# Patient Record
Sex: Female | Born: 1970
Health system: Southern US, Community
[De-identification: ages and names within clinical notes are randomized; demographics above are authoritative.]

## PROBLEM LIST (undated history)

## (undated) DIAGNOSIS — Z87442 Personal history of urinary calculi: Secondary | ICD-10-CM

## (undated) DIAGNOSIS — G43909 Migraine, unspecified, not intractable, without status migrainosus: Secondary | ICD-10-CM

## (undated) DIAGNOSIS — H409 Unspecified glaucoma: Secondary | ICD-10-CM

## (undated) DIAGNOSIS — J189 Pneumonia, unspecified organism: Secondary | ICD-10-CM

## (undated) DIAGNOSIS — B159 Hepatitis A without hepatic coma: Secondary | ICD-10-CM

## (undated) HISTORY — PX: SHOULDER ARTHROSCOPY: SHX128

## (undated) HISTORY — DX: Pneumonia, unspecified organism: J18.9

## (undated) HISTORY — PX: REPAIR PERONEAL TENDONS ANKLE: SUR1201

## (undated) HISTORY — DX: Migraine, unspecified, not intractable, without status migrainosus: G43.909

## (undated) HISTORY — DX: Hepatitis a without hepatic coma: B15.9

## (undated) HISTORY — DX: Unspecified glaucoma: H40.9

## (undated) HISTORY — PX: CERVICAL POLYPECTOMY: SHX88

---

## 1997-08-29 ENCOUNTER — Other Ambulatory Visit: Admission: RE | Admit: 1997-08-29 | Discharge: 1997-08-29 | Payer: Self-pay | Admitting: Obstetrics and Gynecology

## 1997-11-21 ENCOUNTER — Inpatient Hospital Stay (HOSPITAL_COMMUNITY): Admission: AD | Admit: 1997-11-21 | Discharge: 1997-11-23 | Payer: Self-pay | Admitting: Obstetrics and Gynecology

## 1997-11-26 ENCOUNTER — Encounter (HOSPITAL_COMMUNITY): Admission: RE | Admit: 1997-11-26 | Discharge: 1998-02-24 | Payer: Self-pay | Admitting: Obstetrics and Gynecology

## 1998-02-15 ENCOUNTER — Encounter: Payer: Self-pay | Admitting: Orthopedic Surgery

## 1998-02-15 ENCOUNTER — Ambulatory Visit (HOSPITAL_COMMUNITY): Admission: RE | Admit: 1998-02-15 | Discharge: 1998-02-15 | Payer: Self-pay | Admitting: Orthopedic Surgery

## 1998-05-25 ENCOUNTER — Other Ambulatory Visit: Admission: RE | Admit: 1998-05-25 | Discharge: 1998-05-25 | Payer: Self-pay | Admitting: Obstetrics & Gynecology

## 1999-05-17 ENCOUNTER — Other Ambulatory Visit: Admission: RE | Admit: 1999-05-17 | Discharge: 1999-05-17 | Payer: Self-pay | Admitting: Obstetrics and Gynecology

## 2000-01-31 ENCOUNTER — Inpatient Hospital Stay (HOSPITAL_COMMUNITY): Admission: AD | Admit: 2000-01-31 | Discharge: 2000-01-31 | Payer: Self-pay | Admitting: Obstetrics and Gynecology

## 2000-02-04 ENCOUNTER — Inpatient Hospital Stay (HOSPITAL_COMMUNITY): Admission: AD | Admit: 2000-02-04 | Discharge: 2000-02-04 | Payer: Self-pay | Admitting: Obstetrics & Gynecology

## 2000-02-05 ENCOUNTER — Inpatient Hospital Stay (HOSPITAL_COMMUNITY): Admission: AD | Admit: 2000-02-05 | Discharge: 2000-02-05 | Payer: Self-pay | Admitting: *Deleted

## 2000-02-08 ENCOUNTER — Inpatient Hospital Stay (HOSPITAL_COMMUNITY): Admission: AD | Admit: 2000-02-08 | Discharge: 2000-02-08 | Payer: Self-pay | Admitting: *Deleted

## 2000-02-14 ENCOUNTER — Inpatient Hospital Stay (HOSPITAL_COMMUNITY): Admission: AD | Admit: 2000-02-14 | Discharge: 2000-02-14 | Payer: Self-pay | Admitting: Obstetrics and Gynecology

## 2000-03-04 ENCOUNTER — Inpatient Hospital Stay (HOSPITAL_COMMUNITY): Admission: AD | Admit: 2000-03-04 | Discharge: 2000-03-06 | Payer: Self-pay | Admitting: Obstetrics and Gynecology

## 2000-04-24 ENCOUNTER — Other Ambulatory Visit: Admission: RE | Admit: 2000-04-24 | Discharge: 2000-04-24 | Payer: Self-pay | Admitting: Obstetrics and Gynecology

## 2001-11-20 ENCOUNTER — Other Ambulatory Visit: Admission: RE | Admit: 2001-11-20 | Discharge: 2001-11-20 | Payer: Self-pay | Admitting: Obstetrics and Gynecology

## 2002-03-15 ENCOUNTER — Encounter: Payer: Self-pay | Admitting: Obstetrics and Gynecology

## 2002-03-15 ENCOUNTER — Ambulatory Visit (HOSPITAL_COMMUNITY): Admission: RE | Admit: 2002-03-15 | Discharge: 2002-03-15 | Payer: Self-pay | Admitting: Obstetrics and Gynecology

## 2002-06-08 ENCOUNTER — Inpatient Hospital Stay (HOSPITAL_COMMUNITY): Admission: AD | Admit: 2002-06-08 | Discharge: 2002-06-10 | Payer: Self-pay | Admitting: Obstetrics and Gynecology

## 2002-12-29 ENCOUNTER — Other Ambulatory Visit: Admission: RE | Admit: 2002-12-29 | Discharge: 2002-12-29 | Payer: Self-pay | Admitting: Obstetrics and Gynecology

## 2003-02-07 ENCOUNTER — Ambulatory Visit (HOSPITAL_COMMUNITY): Admission: RE | Admit: 2003-02-07 | Discharge: 2003-02-07 | Payer: Self-pay | Admitting: Neurosurgery

## 2003-11-25 ENCOUNTER — Ambulatory Visit (HOSPITAL_COMMUNITY): Admission: RE | Admit: 2003-11-25 | Discharge: 2003-11-25 | Payer: Self-pay | Admitting: Obstetrics and Gynecology

## 2003-12-10 ENCOUNTER — Inpatient Hospital Stay (HOSPITAL_COMMUNITY): Admission: AD | Admit: 2003-12-10 | Discharge: 2003-12-10 | Payer: Self-pay | Admitting: Obstetrics and Gynecology

## 2004-02-03 ENCOUNTER — Other Ambulatory Visit: Admission: RE | Admit: 2004-02-03 | Discharge: 2004-02-03 | Payer: Self-pay | Admitting: Obstetrics and Gynecology

## 2004-03-15 ENCOUNTER — Ambulatory Visit (HOSPITAL_COMMUNITY): Admission: RE | Admit: 2004-03-15 | Discharge: 2004-03-15 | Payer: Self-pay | Admitting: Obstetrics and Gynecology

## 2004-04-13 ENCOUNTER — Inpatient Hospital Stay (HOSPITAL_COMMUNITY): Admission: AD | Admit: 2004-04-13 | Discharge: 2004-04-15 | Payer: Self-pay | Admitting: Obstetrics and Gynecology

## 2008-01-26 ENCOUNTER — Encounter: Admission: RE | Admit: 2008-01-26 | Discharge: 2008-01-26 | Payer: Self-pay | Admitting: Orthopedic Surgery

## 2010-08-03 NOTE — H&P (Signed)
[facility]  Patient:    Ashley Livingston, Ashley Livingston                    MRN: [id number] Adm. Date:  [id number] Disc. Date: [id number] Attending:  Shaune Spittle Dictator:   Nigel Bridgeman, C.N.M.                         History and Physical  HISTORY OF PRESENT ILLNESS:   Ashley Livingston is a 40 year old gravida 3, para 2-0-0-2 at 37 weeks who presents with uterine contractions every 5-8 minutes and pelvic pressure.  She also has had bloody show over the last several days. Cervix was 3 cm in the office on Friday.  PRENATAL LABORATORY DATA:     Blood type is A positive.  Rh antibody negative. VDRL nonreactive.  Rubella titer positive.  Hepatitis B surface antigen negative.  Pap smear was normal in May.  Glucose challenge was normal.  AFP was declined.  Hemoglobin upon entering the practice was 13.6.  It was 12.8 at 26 weeks.  EDC of March 24, 2000 was established by last menstrual period and was in agreement with ultrasound at approximately 18 weeks.  Group B strep culture was positive at 36 weeks.  HISTORY OF PRESENT PREGNANCY:            The patient entered care at approximately six weeks with her physical exam at 10 weeks.  She had a normal ultrasound at 18 weeks.  She had some vaginal bleeding at 22 weeks and was seen at a hospital in [place].  She had another ultrasound at 28 weeks which showed normal growth and normal fluid.  The ultrasound was done secondary to size less than date.  She had an ultrasound again at 32 weeks, again for size less than date, which showed decreased amniotic fluid and less than a tenth percentile growth. She began twice weekly NSTs.  She began to push p.o. fluids and was placed on bed rest.  She had a CST at 33 weeks for an equivocal CST.  At 34 weeks her cervix was 1 cm.  She had a follow-up ultrasound on February 14, 2000, at 34 weeks that showed normal fluid and normal growth.  From that point, the NSTs were  discontinued.  PREVIOUS PREGNANCY HISTORY:   In 1998, she had a vaginal birth of a female infant weighing 6 pounds 14 ounces at [redacted] weeks gestation.  She was in labor approximately five hours.  She had no anesthesia.  She had no complications. In 1999, she had a vaginal birth of a female infant, weight 7 pounds 12 ounces at 39 weeks.  She was in labor approximately five hours.  She had no complications.  In January 2001, she had a questionable early SAB.  She had a negative quantitative, but she did have a positive UPT earlier.  This is not factored into her pregnancy history.  PAST MEDICAL HISTORY:         She had Ortho-Novum last used in November 2000. Her last yeast infection was approximately nine years ago.  She was diagnosed with hepatitis A approximately four years ago in 1974 while living in [place]. It was contracted through water, and all siblings had it then.  When she was born, she had a lung collapsed and had a chest tube.  She had pneumonia with her first pregnancy.  She has a mild varicosity in her right leg.  ALLERGIES:  ASPIRIN causes swelling.  FAMILY HISTORY:               Her mother has varicosities in both legs.  Her maternal aunts both have thyroid issue.  One aunt is on medication.  GENETIC HISTORY:              Remarkable for the babys fathers first cousin was born with mental retardation.  SOCIAL HISTORY:               The patient is married to the father of the baby.  He is involved and supportive.  His name is Scott Fix.  He is graduate educated and employed as a Adult nurse.  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  She has been followed by the certified nurse services at [facility].  She denies any alcohol, drug, or tobacco use during this pregnancy.  She is Caucasian of the Catholic faith.  PHYSICAL EXAMINATION:  VITAL SIGNS:                  Stable.  Afebrile.  HEENT:                         Within normal limits.  LUNGS:                        Breath sounds are clear.  HEART:                        Regular rate and rhythm without murmur.  BREASTS:                      Soft and nontender.  ABDOMEN:                      Fundal height is approximately 34 cm.  Estimated fetal weight is 6 pounds.  Uterine contractions are every 2-4 minutes, mild quality.  CERVICAL:                     5-6 cm, 70% vertex at a -1 station.  Fetal heart rate is active with no decelerations.  There is a negative spontaneous CST.  EXTREMITIES:                  Deep tendon reflexes are 2+ without clonus. There is trace edema noted.  IMPRESSION:                   1. Intrauterine pregnancy at 37 weeks.                               2. Early labor.                               3. Positive Group B strep.  PLAN:                         1. Admit to birthing suite for consult with Dr.                                  Marline Backbone as attending physician.  2. Routine certified nurse widwife orders.                               3. Plan Group B strep prophylaxis as penicillin                                  G per protocol.                               4. Will offer artificial rupture of membranes                                  once AROM is initiated. DD:  03/04/00 TD:  03/04/00 Job: [id number]

## 2010-08-03 NOTE — H&P (Signed)
NAMEBRYLEE, BERK                       ACCOUNT NO.:  [id number]   MEDICAL RECORD NO.:  [id number]                   PATIENT TYPE:  INP   LOCATION:  9119                                 [facility]ILITY:  [facility]   PHYSICIAN:  Osborn Coho, M.D.                DATE OF BIRTH:  1971/01/04   DATE OF ADMISSION:  06/08/2002  DATE OF DISCHARGE:                                HISTORY & PHYSICAL   HISTORY OF PRESENT ILLNESS:  The patient is a 40 year old gravida 4 para 3-0-  0-3 who presents at [redacted] weeks gestation with contractions increasing in  frequency and intensity.  They are now four to five minutes apart for  greater than one hour.  She was evaluated in the office of [facility] this a.m.  for irregular contractions at that time.  She had positive fetal movement,  no bleeding, no rupture of membranes.  Her cervix was noted to be a stretchy  5 cm, 70% effaced, with the cephalic presenting part at that time at a -1  station.  As the patient was not actively laboring at that time she elected  to go home until labor symptoms increased, which she has indicated that they  have and she is now admitted in labor.  Her pregnancy has benign followed by  the C.N.M. service at [facility] and is remarkable for:  1. Irregular cycles.  2. History of rapid labor.  3. History of oligohydramnios with a past pregnancy.  4. Group B strep positive.   This patient was initially evaluated at the office of [facility] on November 20, 2001 at approximately [redacted] weeks gestation.  EDC determined by dates and  confirmed by pregnancy ultrasonography in the first and second trimester.  Her pregnancy has been essentially unremarkable.  She was size equal to  dates until the third trimester, during which she has measured size less  than dates.  Ultrasound exam has shown adequate growth and adequate fluid.   PRENATAL LABORATORY DATA:  On November 20, 2001:  Hemoglobin and hematocrit  13.2 and 39.1; platelets 193,000.  Blood type and Rh A  positive, antibody  screen negative.  VDRL nonreactive.  Rubella immune.  Hepatitis B surface  antigen negative.  HIV declined.  Pap smear within normal limits.  GC and  chlamydia negative.  AFP/free beta hCG declined.  At 28 weeks, one-hour  glucose challenge 121 and hemoglobin 11.9.  At 36 weeks culture of the  vaginal tract was positive for group B strep.   OBSTETRICAL HISTORY:  1. In 1998 the patient had a normal spontaneous vaginal delivery with the     birth of a 6 pound 14 ounce female infant named Santina Evans with no     complications, at term.  2. In 1999 the patient had a normal spontaneous vaginal delivery at term     with the birth of a 7 pound 12 ounce female infant named Animator  with no     complications.  3. In 2001 the patient had normal spontaneous vaginal delivery at term with     the birth of a 6 pound 3 ounce female infant named Angelia with no     complications.  4. The current pregnancy.   MEDICAL HISTORY:  The patient did contract hepatitis A in [place] in [place].  She has varicose veins in her right leg.  When she was born she had a  collapsed lung and had a chest tube.  She had pneumonia with her first  pregnancy.   FAMILY HISTORY:  Maternal aunt with thyroid disease, taking Synthroid.   GENETIC HISTORY:  A father-of-the-baby's first cousin has mental  retardation, ? etiology.  Otherwise, there is no family history of familial  or genetic disorders, children that died in infancy or that were born with  birth defects.   ALLERGIES:  No known drug allergies.   HABITS:  Denies the use of tobacco, alcohol, or illicit drugs.   SOCIAL HISTORY:  The patient is a married 40 year old Caucasian female.  Her  husband, Casimiro Needle, is involved and supportive.  They are Catholic in their  faith.   REVIEW OF SYSTEMS:  Are as described above.  The patient is typical of one  with a uterine pregnancy at term in early labor.   PHYSICAL EXAMINATION:  VITAL SIGNS:  Stable,  afebrile.  HEENT:  Unremarkable.  HEART:  Regular rate and rhythm.  LUNGS:  Clear.  ABDOMEN:  Gravid in its contour.  Uterine fundus is noted to extend 39 cm  above the level of the pubic symphysis.  Leopold's maneuvers finds the  infant to be in a longitudinal lie, cephalic presentation, and the estimated  fetal weight is 6.5 pounds.  EXTREMITIES:  Show no pathologic edema.  DTRs are 1+ and no clonus.   ASSESSMENT:  1. Intrauterine pregnancy at term.  2. Early labor.   PLAN:  1. Admit to birthing suites in early labor.  2. Routine C.N.M. orders.     Rica Koyanagi, C.N.M.               Osborn Coho, M.D.    SDM/MEDQ  D:  06/08/2002  T:  06/08/2002  Job:  [id number]

## 2010-08-03 NOTE — H&P (Signed)
Ashley Livingston, Ashley Livingston             ACCOUNT NO.:  [id number]   MEDICAL RECORD NO.:  [id number]          PATIENT TYPE:  INP   LOCATION:  9172                          [facility]ILITY:  [facility]   PHYSICIAN:  Hal Morales, M.D.DATE OF BIRTH:  [birth year]   DATE OF ADMISSION:  04/13/2004  DATE OF DISCHARGE:                                HISTORY & PHYSICAL   This is a 40 year old, gravida 5, para 4-0-0-4 at 68 and 2/7 weeks who  presents in active labor. She denies leaking or bleeding and reports  positive fetal movement.  The pregnancy has been followed by the nurse  midwife service and remarkable for:  1) AMA, 2) history of oligo with her  third pregnancy, 3) history of rapid labor, 4) group B strep positive.   OB HISTORY:  Remarkable for vaginal delivery in 1998 of a female infant at  [redacted] weeks gestation weighing 6 pounds 14 ounces with no complications. She  had a vaginal delivery in 1999 of a female infant at [redacted] weeks gestation  weighing 7 pounds 12 ounces with no complications.  She had a vaginal  delivery in 2001 of a female infant at [redacted] weeks gestation weighing 6 pounds  3 ounces remarkable for oligohydramnios. She had a vaginal delivery in 2004  of a female infant at [redacted] weeks gestation weighing 7 pounds 5 ounces with no  complications.   MEDICAL HISTORY:  Remarkable for childhood varicella, varicosities, and  pneumonia with her first pregnancy.   PAST SURGICAL HISTORY:  Unremarkable.   FAMILY HISTORY:  Remarkable for an aunt with hypothyroidism.   GENETIC HISTORY:  Remarkable for father of the baby's first cousin with  mental retardation.   SOCIAL HISTORY:  The patient is married to Deyana Wnuk who is involved  and supportive. She is of the catholic faith, she is a Futures trader.  Her  husband is  PT.  She denies any alcohol, tobacco or drug use.   ALLERGIES:  ASPIRIN causes swelling.   PRENATAL LABS:  Hemoglobin 13.6, platelets 213, blood type A positive,  antibody screen  negative, RPR nonreactive, rubella immune, hepatitis  negative.  HIV negative. Pap test normal.  Gonorrhea negative.  Chlamydia  negative.  Quad screen declined. Glucola normal and group B strep positive.   HISTORY OF CURRENT PREGNANCY:  The patient entered care at [redacted] weeks  gestation, she had a normal ultrasound at 18 weeks.  She had an episode of  diarrhea at 22 weeks which resolved. Glucola was normal and she had an  ultrasound at 34 weeks showing 75-90% growth and her group B strep was  positive.   OBJECTIVE DATA:  VITAL SIGNS:  Stable, afebrile.  HEENT:  Within normal limits.  Thyroid normal not enlarged.  CHEST:  Clear to auscultation.  HEART:  Regular rate and rhythm.  ABDOMEN:  Gravida at 38 cm, vertex [place]. EFM shows a reassuring fetal  heart rate with contractions every 2-4 minutes.  Cervix is 6-7 cm dilated,  80% effaced, -1 station with a vertex presentation.  EXTREMITIES:  Within normal limits.   ASSESSMENT:  1.  Intrauterine pregnancy  at term.  2.  Active labor.  3.  Group B strep positive.   PLAN:  1.  Admit to birthing suite, Dr. Normand Sloop to be notified.  2.  Routine CNM orders.  3.  Anticipate spontaneous vaginal delivery.  4.  Group B strep prophylaxis.      MLW/MEDQ  D:  04/13/2004  T:  04/13/2004  Job:  [id number]

## 2011-06-18 ENCOUNTER — Other Ambulatory Visit: Payer: Self-pay | Admitting: Obstetrics and Gynecology

## 2011-06-18 DIAGNOSIS — Z1231 Encounter for screening mammogram for malignant neoplasm of breast: Secondary | ICD-10-CM

## 2011-06-27 ENCOUNTER — Ambulatory Visit
Admission: RE | Admit: 2011-06-27 | Discharge: 2011-06-27 | Disposition: A | Discharge status: Home / Self Care | Payer: 59 | Source: Ambulatory Visit | Attending: Obstetrics and Gynecology | Admitting: Obstetrics and Gynecology

## 2011-06-27 DIAGNOSIS — Z1231 Encounter for screening mammogram for malignant neoplasm of breast: Secondary | ICD-10-CM

## 2014-10-09 ENCOUNTER — Ambulatory Visit (INDEPENDENT_AMBULATORY_CARE_PROVIDER_SITE_OTHER): Payer: 59 | Admitting: Internal Medicine

## 2014-10-09 VITALS — BP 122/72 | HR 80 | Temp 97.7°F | Resp 17 | Ht 68.0 in | Wt 152.0 lb

## 2014-10-09 DIAGNOSIS — J0100 Acute maxillary sinusitis, unspecified: Secondary | ICD-10-CM

## 2014-10-09 DIAGNOSIS — R509 Fever, unspecified: Secondary | ICD-10-CM

## 2014-10-09 DIAGNOSIS — J029 Acute pharyngitis, unspecified: Secondary | ICD-10-CM

## 2014-10-09 LAB — POCT CBC
Granulocyte percent: 81.4 %G — AB (ref 37–80)
HEMATOCRIT: 39.1 % (ref 37.7–47.9)
HEMOGLOBIN: 13.5 g/dL (ref 12.2–16.2)
LYMPH, POC: 1.5 (ref 0.6–3.4)
MCH: 29.3 pg (ref 27–31.2)
MCHC: 34.5 g/dL (ref 31.8–35.4)
MCV: 84.9 fL (ref 80–97)
MID (cbc): 0.2 (ref 0–0.9)
MPV: 7.7 fL (ref 0–99.8)
POC Granulocyte: 7.6 — AB (ref 2–6.9)
POC LYMPH %: 16.2 % (ref 10–50)
POC MID %: 2.4 %M (ref 0–12)
Platelet Count, POC: 264 10*3/uL (ref 142–424)
RBC: 4.6 M/uL (ref 4.04–5.48)
RDW, POC: 12.4 %
WBC: 9.3 10*3/uL (ref 4.6–10.2)

## 2014-10-09 LAB — POCT RAPID STREP A (OFFICE): Rapid Strep A Screen: NEGATIVE

## 2014-10-09 MED ORDER — AMOXICILLIN 875 MG PO TABS: 875.0000 mg | ORAL_TABLET | Freq: Two times a day (BID) | ORAL | Status: DC

## 2014-10-09 NOTE — Progress Notes (Addendum)
Subjective:    Patient ID: Ashley Livingston, female    DOB: 06-25-1970, 44 y.o.   MRN: [id number] This chart was scribed for Ellamae Sia, MD by Littie Deeds, Medical Scribe. This patient was seen in Room 1 and the patient's care was started at 12:49 PM.   HPI HPI Comments: Ashley Livingston is a 44 y.o. female who presents to the [facility] complaining of gradual onset fever with T-max 100 F that started 2 days ago. Patient had a worsening sore throat with burning pain that started 10 days ago, but this has resolved. She also reports having cough, splitting headache, generalized myalgias, congestion, ear fullness, and cervical lymph node swelling. She did have some SOB but she attributes this to the congestion and being in the heat. She has been taking Motrin for her symptoms. Patient denies rash. She had been traveling for 3 weeks in [place], including hiking in [place].  There are no active problems to display for this patient.    Review of Systems     Objective:   Physical Exam  Constitutional: She is oriented to person, place, and time. She appears well-developed and well-nourished. No distress.  HENT:  Head: Normocephalic and atraumatic.  Mouth/Throat: Oropharynx is clear and moist. No oropharyngeal exudate.  Both TMs with fluid Nares boggy with purulent discharge bilaterally  Eyes: Conjunctivae are normal. Pupils are equal, round, and reactive to light.  Neck: Neck supple. No thyromegaly present.  Cardiovascular: Normal rate, regular rhythm and normal heart sounds.   No murmur heard. Pulmonary/Chest: Effort normal and breath sounds normal. No respiratory distress. She has no wheezes.  Musculoskeletal: She exhibits no edema.  Lymphadenopathy:    She has no cervical adenopathy.  Neurological: She is alert and oriented to person, place, and time. No cranial nerve deficit.  Skin: Skin is warm and dry. No rash noted.  Psychiatric: She has a  normal mood and affect. Her behavior is normal.  Vitals reviewed.  BP 122/72 mmHg  Pulse 80  Temp(Src) 97.7 F (36.5 C) (Oral)  Resp 17  Ht  (1.727 m)  Wt 152 lb (68.947 kg)  BMI 23.12 kg/m2  SpO2 98%  LMP 09/25/2014 Results for orders placed or performed in visit on 10/09/14  POCT rapid strep A  Result Value Ref Range   Rapid Strep A Screen Negative Negative  POCT CBC  Result Value Ref Range   WBC 9.3 4.6 - 10.2 K/uL   Lymph, poc 1.5 0.6 - 3.4   POC LYMPH PERCENT 16.2 10 - 50 %L   MID (cbc) 0.2 0 - 0.9   POC MID % 2.4 0 - 12 %M   POC Granulocyte 7.6 (A) 2 - 6.9   Granulocyte percent 81.4 (A) 37 - 80 %G   RBC 4.60 4.04 - 5.48 M/uL   Hemoglobin 13.5 12.2 - 16.2 g/dL   HCT, POC 78.2 95.6 - 47.9 %   MCV 84.9 80 - 97 fL   MCH, POC 29.3 27 - 31.2 pg   MCHC 34.5 31.8 - 35.4 g/dL   RDW, POC 21.3 %   Platelet Count, POC 264 142 - 424 K/uL   MPV 7.7 0 - 99.8 fL          Assessment & Plan:  Acute maxillary sinusitis, recurrence not specified  Sore throat - Plan: POCT rapid strep A, Antistreptolysin O titer, POCT CBC, CANCELED: POCT glucose (manual entry)  Fever, unspecified fever cause  Meds  ordered this encounter  Medications  . cetirizine (ZYRTEC) 10 MG tablet    Sig: Take 10 mg by mouth daily.  Marland Kitchen amoxicillin (AMOXIL) 875 MG tablet    Sig: Take 1 tablet (875 mg total) by mouth 2 (two) times daily.    Dispense:  20 tablet    Refill:  0   ASO titer will help [place] tell if her preceding illness was strep during her travels  I have completed the patient encounter in its entirety as documented by the scribe, with editing by me where necessary. Camron Monday P. Merla Riches, M.D.

## 2014-10-12 LAB — ANTISTREPTOLYSIN O TITER: ASO: 41 IU/mL (ref <409)

## 2016-05-27 DIAGNOSIS — G43009 Migraine without aura, not intractable, without status migrainosus: Secondary | ICD-10-CM | POA: Insufficient documentation

## 2017-08-19 ENCOUNTER — Other Ambulatory Visit: Payer: Self-pay

## 2017-08-19 ENCOUNTER — Encounter: Payer: Self-pay | Admitting: Family Medicine

## 2017-08-19 ENCOUNTER — Ambulatory Visit (INDEPENDENT_AMBULATORY_CARE_PROVIDER_SITE_OTHER): Payer: 59 | Admitting: Family Medicine

## 2017-08-19 VITALS — BP 108/68 | HR 90 | Temp 98.5°F | Resp 16 | Ht 67.5 in | Wt 113.8 lb

## 2017-08-19 DIAGNOSIS — N200 Calculus of kidney: Secondary | ICD-10-CM | POA: Diagnosis not present

## 2017-08-19 DIAGNOSIS — N23 Unspecified renal colic: Secondary | ICD-10-CM | POA: Diagnosis not present

## 2017-08-19 DIAGNOSIS — R634 Abnormal weight loss: Secondary | ICD-10-CM

## 2017-08-19 LAB — POCT URINALYSIS DIPSTICK
Bilirubin, UA: NEGATIVE
Glucose, UA: NEGATIVE
Ketones, UA: NEGATIVE
LEUKOCYTES UA: NEGATIVE
NITRITE UA: NEGATIVE
PROTEIN UA: NEGATIVE
Spec Grav, UA: 1.02 (ref 1.010–1.025)
Urobilinogen, UA: 0.2 E.U./dL
pH, UA: 6.5 (ref 5.0–8.0)

## 2017-08-19 LAB — POCT URINE PREGNANCY: PREG TEST UR: NEGATIVE

## 2017-08-19 MED ORDER — KETOROLAC TROMETHAMINE 60 MG/2ML IM SOLN
60.0000 mg | Freq: Once | INTRAMUSCULAR | Status: AC
  Administered 2017-08-19: 60 mg via INTRAMUSCULAR

## 2017-08-19 MED ORDER — TAMSULOSIN HCL 0.4 MG PO CAPS: 0.4000 mg | ORAL_CAPSULE | Freq: Every day | ORAL | 0 refills | Status: DC

## 2017-08-19 MED ORDER — ONDANSETRON HCL 4 MG PO TABS: 4.0000 mg | ORAL_TABLET | Freq: Three times a day (TID) | ORAL | 0 refills | Status: DC | PRN

## 2017-08-19 MED ORDER — HYDROCODONE-ACETAMINOPHEN 5-325 MG PO TABS: 1.0000 | ORAL_TABLET | Freq: Four times a day (QID) | ORAL | 0 refills | Status: DC | PRN

## 2017-08-19 NOTE — Progress Notes (Signed)
Subjective  CC:  Chief Complaint  Patient presents with  . Establish Care    [facility] patient, pain in kidney area    HPI: Ashley Livingston is a 47 y.o. female is a former [facility] patient and is here to reestablish care with me today.    She has the following concerns or needs:  Started with mild left flank pain over weekend; then last night awoke with severe left flank pain; comes and goes. Associated with n/v and headache. No f/c/s or gross hematuria although she did note a pink tinge on TP after wiping. Also had several days of diarrhea but this has resolved. Had acute migraine prior to all of this and treated with 2 tryptans. Had 2 pieces of toast today; some water. No appetite. Has tension type headache now. No blurred vision, double visio-n, neck pain, paresis or slurred speech. No h/o kidney stones. No lower abdominal pain or vaginal sxs/discharge. G55 - condoms for birth control. Menses are irregular - more frequent recently.   Admits to weight loss: about 30-40 pounds in last several years. Has no interest in food. No appetite. Family is concerned.  Assessment  1. Nephrolithiasis   2. Renal colic on left side   3. Weight loss       Plan   Clinically consistent with kidney stone: zofran, flomax, norco, strain urine, nsaids and counseling given. Discussed red flags. rec f/u in 48 hours; sooner if worsens. If not improving, CT scan. Check labs.   Push fluids.   Concerned about weight loss : to address at f/u visits. Check tsh.   Follow up:  48 hours for recheck  Orders Placed This Encounter  Procedures  . CBC with Differential/Platelet  . Comprehensive metabolic panel  . TSH  . Urinalysis, Routine w reflex microscopic  . POCT Urinalysis Dipstick  . POCT urine pregnancy   Meds ordered this encounter  Medications  . ketorolac (TORADOL) injection 60 mg  . tamsulosin (FLOMAX) 0.4 MG CAPS capsule    Sig: Take 1 capsule (0.4 mg total) by mouth daily after breakfast.   Dispense:  14 capsule    Refill:  0  . ondansetron (ZOFRAN) 4 MG tablet    Sig: Take 1-2 tablets (4-8 mg total) by mouth every 8 (eight) hours as needed for nausea or vomiting.    Dispense:  20 tablet    Refill:  0  . HYDROcodone-acetaminophen (NORCO) 5-325 MG tablet    Sig: Take 1 tablet by mouth every 6 (six) hours as needed for moderate pain.    Dispense:  20 tablet    Refill:  0      We updated and reviewed the patient's past history in detail and it is documented below.  Patient Active Problem List   Diagnosis Date Noted  . Migraine without aura and without status migrainosus, not intractable 05/27/2016   Health Maintenance  Topic Date Due  . TETANUS/TDAP  07/30/1987  . PAP SMEAR  07/29/1989  . HIV Screening  08/20/2018 (Originally 07/30/1983)  . INFLUENZA VACCINE  10/16/2017    There is no immunization history on file for this patient. Current Meds  Medication Sig  . SUMAtriptan (IMITREX) 100 MG tablet TK 1 T PO ONCE PRF MIGRAINE  . topiramate (TOPAMAX) 50 MG tablet Take 50 mg by mouth 2 (two) times daily.    Allergies: Patient is allergic to asa [aspirin]. Past Medical History Patient  has a past medical history of Hepatitis A, Migraines, and Pneumonia.  Past Surgical History Patient  has a past surgical history that includes Shoulder arthroscopy and Repair peroneal tendons ankle. Family History: Patient family history includes Cancer in her father; Hyperlipidemia in her brother and father; Migraines in her brother, father, and sister. Social History:  Patient  reports that she has never smoked. She has never used smokeless tobacco. She reports that she drinks alcohol. She reports that she does not use drugs.  Review of Systems: Constitutional: negative for fever or malaise Ophthalmic: negative for photophobia, double vision or loss of vision Cardiovascular: negative for chest pain, dyspnea on exertion, or new LE swelling Respiratory: negative for SOB or  persistent cough Gastrointestinal: negative for abdominal pain, change in bowel habits or melena Genitourinary: negative for dysuria or gross hematuria Musculoskeletal: negative for new gait disturbance or muscular weakness Integumentary: negative for new or persistent rashes Neurological: negative for TIA or stroke symptoms Psychiatric: negative for SI or delusions Allergic/Immunologic: negative for hives  Patient Care Team    Relationship Specialty Notifications Start End  Willow Ora, MD PCP - General Family Medicine  08/19/17    Wt Readings from Last 3 Encounters:  08/19/17 113 lb 12.8 oz (51.6 kg)  10/09/14 152 lb (68.9 kg)    Objective  Vitals: BP 108/68   Pulse 90   Temp 98.5 F (36.9 C) (Oral)   Resp 16   Ht 5' 7.5" (1.715 m)   Wt 113 lb 12.8 oz (51.6 kg)   SpO2 98%   BMI 17.56 kg/m  General:  Extremely thin; appears uncomfortable but non-toxic. Normal speech Psych:  Alert and oriented,normal mood and affect HEENT:  Normocephalic, atraumatic, non-icteric sclera, PERRL, oropharynx is without mass or exudate, supple neck without adenopathy, mass or thyromegaly Cardiovascular:  RRR without gallop, rub or murmur, nondisplaced PMI Respiratory:  Good breath sounds bilaterally, CTAB with normal respiratory effort Gastrointestinal: normal bowel sounds, soft, non-tender, no noted masses. No HSM, no CVAT, no renal mass or ttp Skin:  Warm, no rashes Neurologic:    Mental status is normal. Gross motor and sensory exams are normal. Normal gait  Office Visit on 08/19/2017  Component Date Value Ref Range Status  . Color, UA 08/19/2017 yellow   Final  . Clarity, UA 08/19/2017 clear   Final  . Glucose, UA 08/19/2017 Negative  Negative Final  . Bilirubin, UA 08/19/2017 negative   Final  . Ketones, UA 08/19/2017 negative   Final  . Spec Grav, UA 08/19/2017 1.020  1.010 - 1.025 Final  . Blood, UA 08/19/2017 2+   Final  . pH, UA 08/19/2017 6.5  5.0 - 8.0 Final  . Protein, UA  08/19/2017 Negative  Negative Final  . Urobilinogen, UA 08/19/2017 0.2  0.2 or 1.0 E.U./dL Final  . Nitrite, UA 16/12/9602 negative   Final  . Leukocytes, UA 08/19/2017 Negative  Negative Final  . Preg Test, Ur 08/19/2017 Negative  Negative Final     Commons side effects, risks, benefits, and alternatives for medications and treatment plan prescribed today were discussed, and the patient expressed understanding of the given instructions. Patient is instructed to call or message via MyChart if he/she has any questions or concerns regarding our treatment plan. No barriers to understanding were identified. We discussed Red Flag symptoms and signs in detail. Patient expressed understanding regarding what to do in case of urgent or emergency type symptoms.   Medication list was reconciled, printed and provided to the patient in AVS. Patient instructions and summary information was reviewed with  the patient as documented in the AVS. This note was prepared with assistance of Dragon voice recognition software. Occasional wrong-word or sound-a-like substitutions may have occurred due to the inherent limitations of voice recognition software

## 2017-08-19 NOTE — Patient Instructions (Addendum)
It was so good seeing you again! Thank you for establishing with my new practice and allowing me to continue caring for you. It means a lot to me.    I will release your lab results to you on your MyChart account with further instructions. Please reply with any questions.  Return to office in 2 days for recheck.    Kidney Stones Kidney stones (urolithiasis) are rock-like masses that form inside of the kidneys. Kidneys are organs that make pee (urine). A kidney stone can cause very bad pain and can block the flow of pee. The stone usually leaves your body (passes) through your pee. You may need to have a doctor take out the stone. Follow these instructions at home: Eating and drinking  Drink enough fluid to keep your pee clear or pale yellow. This will help you pass the stone.  If told by your doctor, change the foods you eat (your diet). This may include: ? Limiting how much salt (sodium) you eat. ? Eating more fruits and vegetables. ? Limiting how much meat, poultry, fish, and eggs you eat.  Follow instructions from your doctor about eating or drinking restrictions. General instructions  Collect pee samples as told by your doctor. You may need to collect a pee sample: ? 24 hours after a stone comes out. ? 8-12 weeks after a stone comes out, and every 6-12 months after that.  Strain your pee every time you pee (urinate), for as long as told. Use the strainer that your doctor recommends.  Do not throw out the stone. Keep it so that it can be tested by your doctor.  Take over-the-counter and prescription medicines only as told by your doctor.  Keep all follow-up visits as told by your doctor. This is important. You may need follow-up tests. Preventing kidney stones To prevent another kidney stone:  Drink enough fluid to keep your pee clear or pale yellow. This is the best way to prevent kidney stones.  Eat healthy foods.  Avoid certain foods as told by your doctor. You may be  told to eat less protein.  Stay at a healthy weight.  Contact a doctor if:  You have pain that gets worse or does not get better with medicine. Get help right away if:  You have a fever or chills.  You get very bad pain.  You get new pain in your belly (abdomen).  You pass out (faint).  You cannot pee. This information is not intended to replace advice given to you by your health care provider. Make sure you discuss any questions you have with your health care provider. Document Released: 08/21/2007 Document Revised: 11/21/2015 Document Reviewed: 11/21/2015 Elsevier Interactive Patient Education  2017 [employer]

## 2017-08-20 LAB — URINALYSIS, ROUTINE W REFLEX MICROSCOPIC
BILIRUBIN URINE: NEGATIVE
KETONES UR: NEGATIVE
LEUKOCYTES UA: NEGATIVE
NITRITE: NEGATIVE
Specific Gravity, Urine: 1.015 (ref 1.000–1.030)
Total Protein, Urine: NEGATIVE
Urine Glucose: NEGATIVE
Urobilinogen, UA: 0.2 (ref 0.0–1.0)
pH: 7 (ref 5.0–8.0)

## 2017-08-20 LAB — COMPREHENSIVE METABOLIC PANEL
ALT: 17 U/L (ref 0–35)
AST: 19 U/L (ref 0–37)
Albumin: 4.2 g/dL (ref 3.5–5.2)
Alkaline Phosphatase: 70 U/L (ref 39–117)
BUN: 14 mg/dL (ref 6–23)
CHLORIDE: 107 meq/L (ref 96–112)
CO2: 22 mEq/L (ref 19–32)
Calcium: 9.3 mg/dL (ref 8.4–10.5)
Creatinine, Ser: 0.64 mg/dL (ref 0.40–1.20)
GFR: 104.8 mL/min (ref >60.00)
GLUCOSE: 145 mg/dL — AB (ref 70–99)
Potassium: 3.8 mEq/L (ref 3.5–5.1)
SODIUM: 138 meq/L (ref 135–145)
TOTAL PROTEIN: 6.8 g/dL (ref 6.0–8.3)
Total Bilirubin: 0.4 mg/dL (ref 0.2–1.2)

## 2017-08-20 LAB — CBC WITH DIFFERENTIAL/PLATELET
Basophils Absolute: 0.1 10*3/uL (ref 0.0–0.1)
Basophils Relative: 0.8 % (ref 0.0–3.0)
EOS PCT: 0.1 % (ref 0.0–5.0)
Eosinophils Absolute: 0 10*3/uL (ref 0.0–0.7)
HCT: 36.6 % (ref 36.0–46.0)
Hemoglobin: 12.6 g/dL (ref 12.0–15.0)
LYMPHS ABS: 1.2 10*3/uL (ref 0.7–4.0)
Lymphocytes Relative: 17.5 % (ref 12.0–46.0)
MCHC: 34.5 g/dL (ref 30.0–36.0)
MCV: 87.9 fl (ref 78.0–100.0)
MONO ABS: 0.3 10*3/uL (ref 0.1–1.0)
Monocytes Relative: 4.7 % (ref 3.0–12.0)
NEUTROS PCT: 76.9 % (ref 43.0–77.0)
Neutro Abs: 5.2 10*3/uL (ref 1.4–7.7)
Platelets: 201 10*3/uL (ref 150.0–400.0)
RBC: 4.16 Mil/uL (ref 3.87–5.11)
RDW: 12.3 % (ref 11.5–15.5)
WBC: 6.8 10*3/uL (ref 4.0–10.5)

## 2017-08-20 LAB — TSH: TSH: 0.97 u[IU]/mL (ref 0.35–4.50)

## 2017-08-21 ENCOUNTER — Encounter: Payer: Self-pay | Admitting: Family Medicine

## 2017-08-21 ENCOUNTER — Other Ambulatory Visit: Payer: Self-pay

## 2017-08-21 ENCOUNTER — Ambulatory Visit (INDEPENDENT_AMBULATORY_CARE_PROVIDER_SITE_OTHER): Payer: 59 | Admitting: Family Medicine

## 2017-08-21 VITALS — BP 98/62 | HR 94 | Temp 98.0°F | Ht 67.5 in | Wt 113.6 lb

## 2017-08-21 DIAGNOSIS — N23 Unspecified renal colic: Secondary | ICD-10-CM | POA: Diagnosis not present

## 2017-08-21 DIAGNOSIS — N200 Calculus of kidney: Secondary | ICD-10-CM

## 2017-08-21 LAB — POCT URINALYSIS DIPSTICK
BILIRUBIN UA: NEGATIVE
Glucose, UA: NEGATIVE
KETONES UA: NEGATIVE
Leukocytes, UA: NEGATIVE
NITRITE UA: NEGATIVE
PROTEIN UA: NEGATIVE
SPEC GRAV UA: 1.01 (ref 1.010–1.025)
Urobilinogen, UA: 0.2 E.U./dL
pH, UA: 6 (ref 5.0–8.0)

## 2017-08-21 NOTE — Progress Notes (Signed)
Subjective  CC:  Chief Complaint  Patient presents with  . Nephrolithiasis    followup from Tuesday, states she feels better than on Tuesday, but has not passed any stones    HPI: Ashley Livingston is a 47 y.o. female who presents to the office today to address the problems listed above in the chief complaint.  F/u for suspected left renal stone: doing better. Pain medications helped significantly; went all night without one, took one this am due to left flank "soreness". No longer having the severe pain. No new sxs; no gross hematuria or fever. Drinking excessively: 32oz water q hour!. Able to keep down food now. Had diarrhea yesterday; nonbloody w/o mucous or abdominal pain; was bloated from excessive water intake. Better today.   Assessment  1. Nephrolithiasis   2. Renal colic on left side      Plan   Kidney stone:  Improving. Check urine to r/o infection developing. Give more time with same meds. Improved overall. Recheck next week and order imaging if not resolved.   Follow up: 1 week if not resolved   Orders Placed This Encounter  Procedures  . POCT urinalysis dipstick   No orders of the defined types were placed in this encounter.     I reviewed the patients updated PMH, FH, and SocHx.    Patient Active Problem List   Diagnosis Date Noted  . Migraine without aura and without status migrainosus, not intractable 05/27/2016   Current Meds  Medication Sig  . cetirizine (ZYRTEC) 10 MG tablet Take 10 mg by mouth daily.  Marland Kitchen HYDROcodone-acetaminophen (NORCO) 5-325 MG tablet Take 1 tablet by mouth every 6 (six) hours as needed for moderate pain.  Marland Kitchen ondansetron (ZOFRAN) 4 MG tablet Take 1-2 tablets (4-8 mg total) by mouth every 8 (eight) hours as needed for nausea or vomiting.  . SUMAtriptan (IMITREX) 100 MG tablet TK 1 T PO ONCE PRF MIGRAINE  . tamsulosin (FLOMAX) 0.4 MG CAPS capsule Take 1 capsule (0.4 mg total) by mouth daily after breakfast.  . topiramate (TOPAMAX) 50  MG tablet Take 50 mg by mouth 2 (two) times daily.    Allergies: Patient is allergic to asa [aspirin]. Family History: Patient family history includes Cancer in her father; Hyperlipidemia in her brother and father; Migraines in her brother, father, and sister. Social History:  Patient  reports that she has never smoked. She has never used smokeless tobacco. She reports that she drinks alcohol. She reports that she does not use drugs.  Review of Systems: Constitutional: Negative for fever malaise or anorexia Cardiovascular: negative for chest pain Respiratory: negative for SOB or persistent cough Gastrointestinal: negative for abdominal pain  Objective  Vitals: BP 98/62   Pulse 94   Temp 98 F (36.7 C)   Ht 5' 7.5" (1.715 m)   Wt 113 lb 9.6 oz (51.5 kg)   BMI 17.53 kg/m  General: no acute distress , A&Ox3, looks much better HEENT: PEERL, conjunctiva normal, Oropharynx moist,neck is supple Cardiovascular:  RRR without murmur or gallop.  Respiratory:  Good breath sounds bilaterally, CTAB with normal respiratory effort Left flank ttp, abdomen is soft, nontender w/o masses Skin:  Warm, no rashes No visits with results within 1 Day(s) from this visit.  Latest known visit with results is:  Office Visit on 08/19/2017  Component Date Value Ref Range Status  . WBC 08/19/2017 6.8  4.0 - 10.5 K/uL Final  . RBC 08/19/2017 4.16  3.87 - 5.11 Mil/uL Final  .  Hemoglobin 08/19/2017 12.6  12.0 - 15.0 g/dL Final  . HCT 16/12/9602 36.6  36.0 - 46.0 % Final  . MCV 08/19/2017 87.9  78.0 - 100.0 fl Final  . MCHC 08/19/2017 34.5  30.0 - 36.0 g/dL Final  . RDW 54/11/8117 12.3  11.5 - 15.5 % Final  . Platelets 08/19/2017 201.0  150.0 - 400.0 K/uL Final  . Neutrophils Relative % 08/19/2017 76.9  43.0 - 77.0 % Final  . Lymphocytes Relative 08/19/2017 17.5  12.0 - 46.0 % Final  . Monocytes Relative 08/19/2017 4.7  3.0 - 12.0 % Final  . Eosinophils Relative 08/19/2017 0.1  0.0 - 5.0 % Final  .  Basophils Relative 08/19/2017 0.8  0.0 - 3.0 % Final  . Neutro Abs 08/19/2017 5.2  1.4 - 7.7 K/uL Final  . Lymphs Abs 08/19/2017 1.2  0.7 - 4.0 K/uL Final  . Monocytes Absolute 08/19/2017 0.3  0.1 - 1.0 K/uL Final  . Eosinophils Absolute 08/19/2017 0.0  0.0 - 0.7 K/uL Final  . Basophils Absolute 08/19/2017 0.1  0.0 - 0.1 K/uL Final  . Sodium 08/19/2017 138  135 - 145 mEq/L Final  . Potassium 08/19/2017 3.8  3.5 - 5.1 mEq/L Final  . Chloride 08/19/2017 107  96 - 112 mEq/L Final  . CO2 08/19/2017 22  19 - 32 mEq/L Final  . Glucose, Bld 08/19/2017 145* 70 - 99 mg/dL Final  . BUN 14/78/2956 14  6 - 23 mg/dL Final  . Creatinine, Ser 08/19/2017 0.64  0.40 - 1.20 mg/dL Final  . Total Bilirubin 08/19/2017 0.4  0.2 - 1.2 mg/dL Final  . Alkaline Phosphatase 08/19/2017 70  39 - 117 U/L Final  . AST 08/19/2017 19  0 - 37 U/L Final  . ALT 08/19/2017 17  0 - 35 U/L Final  . Total Protein 08/19/2017 6.8  6.0 - 8.3 g/dL Final  . Albumin 21/30/8657 4.2  3.5 - 5.2 g/dL Final  . Calcium 84/69/6295 9.3  8.4 - 10.5 mg/dL Final  . GFR 28/41/3244 104.80  >60.00 mL/min Final  . Color, UA 08/19/2017 yellow   Final  . Clarity, UA 08/19/2017 clear   Final  . Glucose, UA 08/19/2017 Negative  Negative Final  . Bilirubin, UA 08/19/2017 negative   Final  . Ketones, UA 08/19/2017 negative   Final  . Spec Grav, UA 08/19/2017 1.020  1.010 - 1.025 Final  . Blood, UA 08/19/2017 2+   Final  . pH, UA 08/19/2017 6.5  5.0 - 8.0 Final  . Protein, UA 08/19/2017 Negative  Negative Final  . Urobilinogen, UA 08/19/2017 0.2  0.2 or 1.0 E.U./dL Final  . Nitrite, UA 03/20/7251 negative   Final  . Leukocytes, UA 08/19/2017 Negative  Negative Final  . TSH 08/19/2017 0.97  0.35 - 4.50 uIU/mL Final  . Preg Test, Ur 08/19/2017 Negative  Negative Final  . Color, Urine 08/19/2017 YELLOW  Yellow;Lt. Yellow Final  . APPearance 08/19/2017 Cloudy* Clear Final  . Specific Gravity, Urine 08/19/2017 1.015  1.000 - 1.030 Final  . pH  08/19/2017 7.0  5.0 - 8.0 Final  . Total Protein, Urine 08/19/2017 NEGATIVE  Negative Final  . Urine Glucose 08/19/2017 NEGATIVE  Negative Final  . Ketones, ur 08/19/2017 NEGATIVE  Negative Final  . Bilirubin Urine 08/19/2017 NEGATIVE  Negative Final  . Hgb urine dipstick 08/19/2017 MODERATE* Negative Final  . Urobilinogen, UA 08/19/2017 0.2  0.0 - 1.0 Final  . Leukocytes, UA 08/19/2017 NEGATIVE  Negative Final  . Nitrite 08/19/2017  NEGATIVE  Negative Final  . WBC, UA 08/19/2017 0-2/hpf  0-2/hpf Final  . RBC / HPF 08/19/2017 11-20/hpf* 0-2/hpf Final  . Squamous Epithelial / LPF 08/19/2017 Rare(0-4/hpf)  Rare(0-4/hpf) Final  . Granular Casts, UA 08/19/2017 Presence of* None Final   Office Visit on 08/21/2017  Component Date Value Ref Range Status  . Color, UA 08/21/2017 yellow   Final  . Clarity, UA 08/21/2017 clear   Final  . Glucose, UA 08/21/2017 Negative  Negative Final  . Bilirubin, UA 08/21/2017 negative   Final  . Ketones, UA 08/21/2017 negative   Final  . Spec Grav, UA 08/21/2017 1.010  1.010 - 1.025 Final  . Blood, UA 08/21/2017 2+   Final  . pH, UA 08/21/2017 6.0  5.0 - 8.0 Final  . Protein, UA 08/21/2017 Negative  Negative Final  . Urobilinogen, UA 08/21/2017 0.2  0.2 or 1.0 E.U./dL Final  . Nitrite, UA 16/10/960406/08/2017 negative   Final  . Leukocytes, UA 08/21/2017 Negative  Negative Final       Commons side effects, risks, benefits, and alternatives for medications and treatment plan prescribed today were discussed, and the patient expressed understanding of the given instructions. Patient is instructed to call or message via MyChart if he/she has any questions or concerns regarding our treatment plan. No barriers to understanding were identified. We discussed Red Flag symptoms and signs in detail. Patient expressed understanding regarding what to do in case of urgent or emergency type symptoms.   Medication list was reconciled, printed and provided to the patient in AVS.  Patient instructions and summary information was reviewed with the patient as documented in the AVS. This note was prepared with assistance of Dragon voice recognition software. Occasional wrong-word or sound-a-like substitutions may have occurred due to the inherent limitations of voice recognition software

## 2017-08-21 NOTE — Patient Instructions (Signed)
Please return early next week IF you haven't passed a stone or if you remain with pain.   Continue your current medications: flomax daily and pain meds/zofran as needed.  Drink 6-8 glasses of water / day. You may start a stool softener (colace otc) IF you start developing any constipation due to the pain medications.   If you have any questions or concerns, please don't hesitate to send me a message via MyChart or call the office at [phone]. Thank you for visiting with us today! It's our pleasure caring for you.

## 2017-09-01 ENCOUNTER — Encounter: Payer: Self-pay | Admitting: Family Medicine

## 2017-09-01 DIAGNOSIS — N23 Unspecified renal colic: Secondary | ICD-10-CM

## 2017-09-01 DIAGNOSIS — N1330 Unspecified hydronephrosis: Secondary | ICD-10-CM

## 2017-09-01 DIAGNOSIS — N200 Calculus of kidney: Secondary | ICD-10-CM

## 2017-09-03 ENCOUNTER — Ambulatory Visit
Admission: RE | Admit: 2017-09-03 | Discharge: 2017-09-03 | Disposition: A | Discharge status: Home / Self Care | Payer: 59 | Source: Ambulatory Visit | Attending: Family Medicine | Admitting: Family Medicine

## 2017-09-03 ENCOUNTER — Telehealth: Payer: Self-pay | Admitting: Family Medicine

## 2017-09-03 DIAGNOSIS — N200 Calculus of kidney: Secondary | ICD-10-CM

## 2017-09-03 DIAGNOSIS — N23 Unspecified renal colic: Secondary | ICD-10-CM

## 2017-09-03 NOTE — Telephone Encounter (Signed)
[facility] results on CT Renal Stone Study:  IMPRESSION: 1. Left hydronephrosis from a 5 x 6 mm mid ureteral stone. 2. Punctate left renal calculus. 3. Colonic diverticulosis.  Will call office and make Dr. Mardelle MatteAndy aware.

## 2017-09-04 NOTE — Addendum Note (Signed)
Addended by: Asencion PartridgeANDY, Melis Trochez on: 09/04/2017 11:42 AM   Modules accepted: Orders

## 2017-09-04 NOTE — Telephone Encounter (Signed)
See result note. Urology referral placed and pt to be notifed.

## 2017-09-15 ENCOUNTER — Encounter (HOSPITAL_COMMUNITY): Payer: Self-pay | Admitting: Obstetrics and Gynecology

## 2017-09-15 ENCOUNTER — Other Ambulatory Visit: Payer: Self-pay

## 2017-09-15 ENCOUNTER — Ambulatory Visit (HOSPITAL_COMMUNITY)
Admission: EM | Admit: 2017-09-15 | Discharge: 2017-09-15 | Disposition: A | Discharge status: Nursing Home (Includes ICF) | Payer: 59 | Source: Home / Self Care

## 2017-09-15 ENCOUNTER — Emergency Department (HOSPITAL_COMMUNITY): Payer: 59

## 2017-09-15 ENCOUNTER — Emergency Department (HOSPITAL_COMMUNITY)
Admission: EM | Admit: 2017-09-15 | Discharge: 2017-09-15 | Disposition: A | Discharge status: Home / Self Care | Payer: 59 | Attending: Emergency Medicine | Admitting: Emergency Medicine

## 2017-09-15 DIAGNOSIS — Z79899 Other long term (current) drug therapy: Secondary | ICD-10-CM | POA: Diagnosis not present

## 2017-09-15 DIAGNOSIS — R509 Fever, unspecified: Secondary | ICD-10-CM

## 2017-09-15 DIAGNOSIS — R1032 Left lower quadrant pain: Secondary | ICD-10-CM | POA: Diagnosis present

## 2017-09-15 DIAGNOSIS — N200 Calculus of kidney: Secondary | ICD-10-CM | POA: Diagnosis not present

## 2017-09-15 LAB — URINALYSIS, ROUTINE W REFLEX MICROSCOPIC
Bilirubin Urine: NEGATIVE
Glucose, UA: NEGATIVE mg/dL
Hgb urine dipstick: NEGATIVE
Ketones, ur: NEGATIVE mg/dL
Leukocytes, UA: NEGATIVE
Nitrite: NEGATIVE
Protein, ur: NEGATIVE mg/dL
Specific Gravity, Urine: 1 — ABNORMAL LOW (ref 1.005–1.030)
pH: 7 (ref 5.0–8.0)

## 2017-09-15 LAB — I-STAT CHEM 8, ED
CREATININE: 0.5 mg/dL (ref 0.44–1.00)
Calcium, Ion: 1.13 mmol/L — ABNORMAL LOW (ref 1.15–1.40)
Chloride: 104 mmol/L (ref 98–111)
Glucose, Bld: 113 mg/dL — ABNORMAL HIGH (ref 70–99)
HEMATOCRIT: 37 % (ref 36.0–46.0)
Hemoglobin: 12.6 g/dL (ref 12.0–15.0)
Potassium: 3.4 mmol/L — ABNORMAL LOW (ref 3.5–5.1)
Sodium: 138 mmol/L (ref 135–145)
TCO2: 21 mmol/L — ABNORMAL LOW (ref 22–32)

## 2017-09-15 LAB — CBC WITH DIFFERENTIAL/PLATELET
BASOS PCT: 0 %
Basophils Absolute: 0 10*3/uL (ref 0.0–0.1)
EOS ABS: 0 10*3/uL (ref 0.0–0.7)
EOS PCT: 0 %
HCT: 37.2 % (ref 36.0–46.0)
HEMOGLOBIN: 12.7 g/dL (ref 12.0–15.0)
LYMPHS ABS: 1 10*3/uL (ref 0.7–4.0)
Lymphocytes Relative: 9 %
MCH: 30.1 pg (ref 26.0–34.0)
MCHC: 34.1 g/dL (ref 30.0–36.0)
MCV: 88.2 fL (ref 78.0–100.0)
Monocytes Absolute: 0.9 10*3/uL (ref 0.1–1.0)
Monocytes Relative: 8 %
NEUTROS PCT: 83 %
Neutro Abs: 9.7 10*3/uL — ABNORMAL HIGH (ref 1.7–7.7)
Platelets: 246 10*3/uL (ref 150–400)
RBC: 4.22 MIL/uL (ref 3.87–5.11)
RDW: 12.5 % (ref 11.5–15.5)
WBC: 11.6 10*3/uL — AB (ref 4.0–10.5)

## 2017-09-15 LAB — POC URINE PREG, ED: Preg Test, Ur: NEGATIVE

## 2017-09-15 LAB — I-STAT CG4 LACTIC ACID, ED: Lactic Acid, Venous: 0.79 mmol/L (ref 0.5–1.9)

## 2017-09-15 MED ORDER — CEPHALEXIN 500 MG PO CAPS: 500.0000 mg | ORAL_CAPSULE | Freq: Four times a day (QID) | ORAL | 0 refills | Status: DC

## 2017-09-15 MED ORDER — SODIUM CHLORIDE 0.9 % IV BOLUS
1000.0000 mL | Freq: Once | INTRAVENOUS | Status: AC
  Administered 2017-09-15: 1000 mL via INTRAVENOUS

## 2017-09-15 NOTE — ED Provider Notes (Signed)
[facility]-EMERGENCY DEPT Provider Note   CSN: [id number] Arrival date & time: 09/15/17  2004     History   Chief Complaint Chief Complaint  Patient presents with  . Flank Pain    HPI Ashley Livingston is a 47 y.o. female.  HPI Patient presents with left lower abdominal pain.  Began to have fevers.  Known distal kidney stone.  Has been followed by Dr. Vernie Ammons from urology.  Seen at urgent care and sent here for possibly infected obstructed stone.  Pain is around 5 mm.  Had been doing better with pain but it flared up again a few days ago.  Now with no new dysuria but did have temperature up to 100.7 at home.  No cough.  No sore throat.  No nausea vomiting or diarrhea.  No sick contacts. Past Medical History:  Diagnosis Date  . Hepatitis A   . Migraines   . Pneumonia     Patient Active Problem List   Diagnosis Date Noted  . Migraine without aura and without status migrainosus, not intractable 05/27/2016    Past Surgical History:  Procedure Laterality Date  . REPAIR PERONEAL TENDONS ANKLE    . SHOULDER ARTHROSCOPY     Labral repair     OB History   None      Home Medications    Prior to Admission medications   Medication Sig Start Date End Date Taking? Authorizing Provider  acetaminophen (TYLENOL) 500 MG tablet Take 1,000 mg by mouth every 6 (six) hours as needed for mild pain or moderate pain.   Yes [provider]  HYDROcodone-acetaminophen (NORCO) 5-325 MG tablet Take 1 tablet by mouth every 6 (six) hours as needed for moderate pain. 08/19/17  Yes Willow Ora, MD  ondansetron (ZOFRAN) 4 MG tablet Take 1-2 tablets (4-8 mg total) by mouth every 8 (eight) hours as needed for nausea or vomiting. 08/19/17  Yes Willow Ora, MD  SUMAtriptan (IMITREX) 100 MG tablet TK 1 T PO ONCE PRF MIGRAINE 07/20/17  Yes [provider]  tamsulosin (FLOMAX) 0.4 MG CAPS capsule Take 1 capsule (0.4 mg total) by mouth daily after  breakfast. Patient taking differently: Take 0.8 mg by mouth daily after breakfast.  08/19/17  Yes Willow Ora, MD  topiramate (TOPAMAX) 50 MG tablet Take 50 mg by mouth 2 (two) times daily. 06/19/17  Yes [provider]  cephALEXin (KEFLEX) 500 MG capsule Take 1 capsule (500 mg total) by mouth 4 (four) times daily. 09/15/17   Benjiman Core, MD    Family History Family History  Problem Relation Age of Onset  . Hyperlipidemia Father   . Migraines Father   . Cancer Father   . Migraines Sister   . Migraines Brother   . Hyperlipidemia Brother     Social History Social History   Tobacco Use  . Smoking status: Never Smoker  . Smokeless tobacco: Never Used  Substance Use Topics  . Alcohol use: Yes    Alcohol/week: 0.0 oz  . Drug use: Never     Allergies   Asa [aspirin]   Review of Systems Review of Systems  Constitutional: Positive for fever. Negative for appetite change.  HENT: Negative for congestion.   Respiratory: Negative for shortness of breath.   Cardiovascular: Negative for chest pain.  Gastrointestinal: Positive for abdominal pain.  Genitourinary: Positive for flank pain. Negative for dysuria.  Musculoskeletal: Negative for back pain.  Skin: Negative for rash.  Neurological: Negative for  weakness.  Psychiatric/Behavioral: Negative for confusion.     Physical Exam Updated Vital Signs BP 99/60 (BP Location: Right Arm)   Pulse 91   Temp 99.1 F (37.3 C) (Oral)   Resp 20   SpO2 100%   Physical Exam  Constitutional: She appears well-developed.  HENT:  Head: Normocephalic.  Eyes: EOM are normal.  Neck: Neck supple.  Cardiovascular: Normal rate.  Pulmonary/Chest: Effort normal.  Abdominal: There is no tenderness.  Genitourinary:  Genitourinary Comments: No CVA tenderness.  Musculoskeletal: She exhibits no tenderness.  Neurological: She is alert.  Skin: Skin is warm. Capillary refill takes less than 2 seconds.     ED Treatments / Results   Labs (all labs ordered are listed, but only abnormal results are displayed) Labs Reviewed  URINALYSIS, ROUTINE W REFLEX MICROSCOPIC - Abnormal; Notable for the following components:      Result Value   Color, Urine COLORLESS (*)    Specific Gravity, Urine 1.000 (*)    All other components within normal limits  CBC WITH DIFFERENTIAL/PLATELET - Abnormal; Notable for the following components:   WBC 11.6 (*)    Neutro Abs 9.7 (*)    All other components within normal limits  I-STAT CHEM 8, ED - Abnormal; Notable for the following components:   Potassium 3.4 (*)    BUN <3 (*)    Glucose, Bld 113 (*)    Calcium, Ion 1.13 (*)    TCO2 21 (*)    All other components within normal limits  POC URINE PREG, ED  I-STAT CG4 LACTIC ACID, ED    EKG None  Radiology Dg Abdomen 1 View  Result Date: 09/15/2017 CLINICAL DATA:  Left-sided flank pain EXAM: ABDOMEN - 1 VIEW COMPARISON:  Radiograph 09/05/2017, CT 09/03/2017 FINDINGS: Nonobstructed gas pattern. Punctate left pelvic phleboliths. Possible 7 mm oval calcification to the left of the L4-L5 disc space, may reflect the previously noted ureteral stone. This does not appear significantly changed in position. IMPRESSION: 1. Nonobstructed gas pattern 2. Possible 7 mm oval calcification/stone to the left of the L4-L5 disc space, may correspond to the previously noted ureteral stone on CT. Electronically Signed   By: Jasmine PangKim  Fujinaga M.D.   On: 09/15/2017 21:18    Procedures Procedures (including critical care time)  Medications Ordered in ED Medications  sodium chloride 0.9 % bolus 1,000 mL (0 mLs Intravenous Stopped 09/15/17 2259)     Initial Impression / Assessment and Plan / ED Course  I have reviewed the triage vital signs and the nursing notes.  Pertinent labs & imaging results that were available during my care of the patient were reviewed by me and considered in my medical decision making (see chart for details).     Patient with known  kidney stone.  Found to have fever at home.  White count only minimally elevated.  Urine does not show infection.  Good renal function.  X-ray shows likely stone still there.  Discussed with Dr. Liliane ShiWinter.  Clinically this point it does not seem like she has a severe sepsis and also does not seem as if this is an infected stone.  However we will give some IV fluids and discharged with antibiotics.  Follow with Dr. Vernie Ammonsttelin.  Does have benign abdominal exam also.  Final Clinical Impressions(s) / ED Diagnoses   Final diagnoses:  Kidney stone  Fever, unspecified fever cause    ED Discharge Orders        Ordered    cephALEXin (KEFLEX) 500  MG capsule  4 times daily     09/15/17 2252       Benjiman Core, MD 09/15/17 2329

## 2017-09-15 NOTE — ED Notes (Signed)
Per Linward HeadlandAmy Yu, PA, pt to be sent to ED if she has a fever.  Spoke with pt. She had a fever of 100.5 at home and Urology instructed them to go [facility] for IV fluids and abx.  I instructed the pt that they needed to comply with urology's instructions due to further scanning being a possibility and IV abx.  They stated understanding.

## 2017-09-15 NOTE — ED Triage Notes (Signed)
Pt reports she has a confirmed kidney stone and has been following up with [facility]. The provider told her if she spiked a fever higher than 100.3 to call and let them know, as they are currently hoping the kidney stone will pass naturally.  Pt reports that this evening she spiked a fever of 100.5 and called the on call number and the NP told her to come be seen for possible IV antibiotics.

## 2017-09-16 ENCOUNTER — Other Ambulatory Visit: Payer: Self-pay | Admitting: Urology

## 2017-09-17 ENCOUNTER — Encounter (HOSPITAL_COMMUNITY): Payer: Self-pay | Admitting: General Practice

## 2017-09-23 ENCOUNTER — Encounter: Payer: 59 | Admitting: Family Medicine

## 2017-09-25 ENCOUNTER — Encounter (HOSPITAL_COMMUNITY)
Admission: RE | Disposition: A | Discharge status: Home / Self Care | Payer: Self-pay | Source: Other Acute Inpatient Hospital | Attending: Urology

## 2017-09-25 ENCOUNTER — Other Ambulatory Visit: Payer: Self-pay

## 2017-09-25 ENCOUNTER — Encounter (HOSPITAL_COMMUNITY): Payer: Self-pay | Admitting: *Deleted

## 2017-09-25 ENCOUNTER — Ambulatory Visit (HOSPITAL_COMMUNITY): Payer: 59

## 2017-09-25 ENCOUNTER — Ambulatory Visit (HOSPITAL_COMMUNITY)
Admission: RE | Admit: 2017-09-25 | Discharge: 2017-09-25 | Disposition: A | Discharge status: Home / Self Care | Payer: 59 | Source: Other Acute Inpatient Hospital | Attending: Urology | Admitting: Urology

## 2017-09-25 DIAGNOSIS — Z886 Allergy status to analgesic agent status: Secondary | ICD-10-CM | POA: Diagnosis not present

## 2017-09-25 DIAGNOSIS — Z79899 Other long term (current) drug therapy: Secondary | ICD-10-CM | POA: Diagnosis not present

## 2017-09-25 DIAGNOSIS — K759 Inflammatory liver disease, unspecified: Secondary | ICD-10-CM | POA: Diagnosis not present

## 2017-09-25 DIAGNOSIS — N201 Calculus of ureter: Secondary | ICD-10-CM

## 2017-09-25 HISTORY — PX: LITHOTRIPSY: SUR834

## 2017-09-25 HISTORY — DX: Personal history of urinary calculi: Z87.442

## 2017-09-25 HISTORY — PX: EXTRACORPOREAL SHOCK WAVE LITHOTRIPSY: SHX1557

## 2017-09-25 LAB — PREGNANCY, URINE: Preg Test, Ur: NEGATIVE

## 2017-09-25 SURGERY — LITHOTRIPSY, ESWL
Anesthesia: LOCAL | Laterality: Left

## 2017-09-25 MED ORDER — CIPROFLOXACIN HCL 500 MG PO TABS
500.0000 mg | ORAL_TABLET | ORAL | Status: AC
  Administered 2017-09-25: 500 mg via ORAL
  Filled 2017-09-25: qty 1

## 2017-09-25 MED ORDER — DIAZEPAM 5 MG PO TABS
10.0000 mg | ORAL_TABLET | ORAL | Status: AC
  Administered 2017-09-25: 10 mg via ORAL
  Filled 2017-09-25: qty 2

## 2017-09-25 MED ORDER — DIPHENHYDRAMINE HCL 25 MG PO CAPS
25.0000 mg | ORAL_CAPSULE | ORAL | Status: AC
  Administered 2017-09-25: 25 mg via ORAL
  Filled 2017-09-25: qty 1

## 2017-09-25 MED ORDER — SODIUM CHLORIDE 0.9 % IV SOLN
INTRAVENOUS | Status: DC
  Administered 2017-09-25: 09:00:00 via INTRAVENOUS

## 2017-09-25 NOTE — Discharge Instructions (Signed)
Lithotripsy, Care After °This sheet gives you information about how to care for yourself after your procedure. Your health care provider may also give you more specific instructions. If you have problems or questions, contact your health care provider. °What can I expect after the procedure? °After the procedure, it is common to have: °· Some blood in your urine. This should only last for a few days. °· Soreness in your back, sides, or upper abdomen for a few days. °· Blotches or bruises on your back where the pressure wave entered the skin. °· Pain, discomfort, or nausea when pieces (fragments) of the kidney stone move through the tube that carries urine from the kidney to the bladder (ureter). Stone fragments may pass soon after the procedure, but they may continue to pass for up to 4-8 weeks. °? If you have severe pain or nausea, contact your health care provider. This may be caused by a large stone that was not broken up, and this may mean that you need more treatment. °· Some pain or discomfort during urination. °· Some pain or discomfort in the lower abdomen or (in men) at the base of the penis. ° °Follow these instructions at home: °Medicines °· Take over-the-counter and prescription medicines only as told by your health care provider. °· If you were prescribed an antibiotic medicine, take it as told by your health care provider. Do not stop taking the antibiotic even if you start to feel better. °· Do not drive for 24 hours if you were given a medicine to help you relax (sedative). °· Do not drive or use heavy machinery while taking prescription pain medicine. °Eating and drinking °· Drink enough water and fluids to keep your urine clear or pale yellow. This helps any remaining pieces of the stone to pass. It can also help prevent new stones from forming. °· Eat plenty of fresh fruits and vegetables. °· Follow instructions from your health care provider about eating and drinking restrictions. You may be  instructed: °? To reduce how much salt (sodium) you eat or drink. Check ingredients and nutrition facts on packaged foods and beverages. °? To reduce how much meat you eat. °· Eat the recommended amount of calcium for your age and gender. Ask your health care provider how much calcium you should have. °General instructions °· Get plenty of rest. °· Most people can resume normal activities 1-2 days after the procedure. Ask your health care provider what activities are safe for you. °· If directed, strain all urine through the strainer that was provided by your health care provider. °? Keep all fragments for your health care provider to see. Any stones that are found may be sent to a medical lab for examination. The stone may be as small as a grain of salt. °· Keep all follow-up visits as told by your health care provider. This is important. °Contact a health care provider if: °· You have pain that is severe or does not get better with medicine. °· You have nausea that is severe or does not go away. °· You have blood in your urine longer than your health care provider told you to expect. °· You have more blood in your urine. °· You have pain during urination that does not go away. °· You urinate more frequently than usual and this does not go away. °· You develop a rash or any other possible signs of an allergic reaction. °Get help right away if: °· You have severe pain in   your back, sides, or upper abdomen. °· You have severe pain while urinating. °· Your urine is very dark red. °· You have blood in your stool (feces). °· You cannot pass any urine at all. °· You feel a strong urge to urinate after emptying your bladder. °· You have a fever or chills. °· You develop shortness of breath, difficulty breathing, or chest pain. °· You have severe nausea that leads to persistent vomiting. °· You faint. °Summary °· After this procedure, it is common to have some pain, discomfort, or nausea when pieces (fragments) of the  kidney stone move through the tube that carries urine from the kidney to the bladder (ureter). If this pain or nausea is severe, however, you should contact your health care provider. °· Most people can resume normal activities 1-2 days after the procedure. Ask your health care provider what activities are safe for you. °· Drink enough water and fluids to keep your urine clear or pale yellow. This helps any remaining pieces of the stone to pass, and it can help prevent new stones from forming. °· If directed, strain your urine and keep all fragments for your health care provider to see. Fragments or stones may be as small as a grain of salt. °· Get help right away if you have severe pain in your back, sides, or upper abdomen or have severe pain while urinating. °This information is not intended to replace advice given to you by your health care provider. Make sure you discuss any questions you have with your health care provider. °Document Released: 03/24/2007 Document Revised: 01/24/2016 Document Reviewed: 01/24/2016 °Elsevier Interactive Patient Education © 2018 [employer]vier Inc. ° °

## 2017-10-21 ENCOUNTER — Other Ambulatory Visit (HOSPITAL_COMMUNITY)
Admission: RE | Admit: 2017-10-21 | Discharge: 2017-10-21 | Disposition: A | Discharge status: Home / Self Care | Payer: 59 | Source: Ambulatory Visit | Attending: Family Medicine | Admitting: Family Medicine

## 2017-10-21 ENCOUNTER — Ambulatory Visit (INDEPENDENT_AMBULATORY_CARE_PROVIDER_SITE_OTHER): Payer: 59 | Admitting: Family Medicine

## 2017-10-21 ENCOUNTER — Other Ambulatory Visit: Payer: Self-pay

## 2017-10-21 ENCOUNTER — Encounter: Payer: Self-pay | Admitting: Family Medicine

## 2017-10-21 VITALS — BP 92/62 | HR 67 | Temp 98.0°F | Ht 67.5 in | Wt 113.6 lb

## 2017-10-21 DIAGNOSIS — N841 Polyp of cervix uteri: Secondary | ICD-10-CM

## 2017-10-21 DIAGNOSIS — Z0000 Encounter for general adult medical examination without abnormal findings: Secondary | ICD-10-CM

## 2017-10-21 DIAGNOSIS — Z124 Encounter for screening for malignant neoplasm of cervix: Secondary | ICD-10-CM | POA: Diagnosis not present

## 2017-10-21 DIAGNOSIS — R636 Underweight: Secondary | ICD-10-CM

## 2017-10-21 DIAGNOSIS — G43009 Migraine without aura, not intractable, without status migrainosus: Secondary | ICD-10-CM

## 2017-10-21 DIAGNOSIS — N200 Calculus of kidney: Secondary | ICD-10-CM | POA: Insufficient documentation

## 2017-10-21 DIAGNOSIS — Z681 Body mass index (BMI) 19 or less, adult: Secondary | ICD-10-CM | POA: Insufficient documentation

## 2017-10-21 LAB — LIPID PANEL
CHOLESTEROL: 190 mg/dL (ref 0–200)
HDL: 75 mg/dL (ref >39.00)
LDL Cholesterol: 105 mg/dL — ABNORMAL HIGH (ref 0–99)
NONHDL: 115.09
Total CHOL/HDL Ratio: 3
Triglycerides: 51 mg/dL (ref 0.0–149.0)
VLDL: 10.2 mg/dL (ref 0.0–40.0)

## 2017-10-21 LAB — CBC WITH DIFFERENTIAL/PLATELET
BASOS ABS: 0.1 10*3/uL (ref 0.0–0.1)
Basophils Relative: 2.4 % (ref 0.0–3.0)
EOS PCT: 1.4 % (ref 0.0–5.0)
Eosinophils Absolute: 0.1 10*3/uL (ref 0.0–0.7)
HCT: 36.5 % (ref 36.0–46.0)
HEMOGLOBIN: 12.3 g/dL (ref 12.0–15.0)
LYMPHS ABS: 1.3 10*3/uL (ref 0.7–4.0)
Lymphocytes Relative: 32.8 % (ref 12.0–46.0)
MCHC: 33.7 g/dL (ref 30.0–36.0)
MCV: 87.5 fl (ref 78.0–100.0)
MONO ABS: 0.3 10*3/uL (ref 0.1–1.0)
Monocytes Relative: 6.9 % (ref 3.0–12.0)
NEUTROS PCT: 56.5 % (ref 43.0–77.0)
Neutro Abs: 2.3 10*3/uL (ref 1.4–7.7)
Platelets: 218 10*3/uL (ref 150.0–400.0)
RBC: 4.18 Mil/uL (ref 3.87–5.11)
RDW: 13.5 % (ref 11.5–15.5)
WBC: 4 10*3/uL (ref 4.0–10.5)

## 2017-10-21 LAB — COMPREHENSIVE METABOLIC PANEL
ALBUMIN: 4.4 g/dL (ref 3.5–5.2)
ALT: 22 U/L (ref 0–35)
AST: 19 U/L (ref 0–37)
Alkaline Phosphatase: 109 U/L (ref 39–117)
BILIRUBIN TOTAL: 0.5 mg/dL (ref 0.2–1.2)
BUN: 10 mg/dL (ref 6–23)
CO2: 21 mEq/L (ref 19–32)
CREATININE: 0.63 mg/dL (ref 0.40–1.20)
Calcium: 9.1 mg/dL (ref 8.4–10.5)
Chloride: 108 mEq/L (ref 96–112)
GFR: 106.65 mL/min (ref >60.00)
GLUCOSE: 87 mg/dL (ref 70–99)
POTASSIUM: 3.9 meq/L (ref 3.5–5.1)
SODIUM: 138 meq/L (ref 135–145)
Total Protein: 7.7 g/dL (ref 6.0–8.3)

## 2017-10-21 LAB — TSH: TSH: 1.44 u[IU]/mL (ref 0.35–4.50)

## 2017-10-21 NOTE — Addendum Note (Signed)
Addended by: Lenis DickinsonILLARD, BETHANY M on: 10/21/2017 09:55 AM   Modules accepted: Orders

## 2017-10-21 NOTE — Progress Notes (Signed)
Subjective  Chief Complaint  Patient presents with  . Annual Exam    doing well, patient is fasting, pap today     HPI: Ashley Livingston is a 47 y.o. female who presents to [facility] today for a Female Wellness Visit.   Wellness Visit: annual visit with health maintenance review and exam with Pap   Doing ok. Brother passed unexpectedly 2 weeks ago due to massive PE. Pt is grieving.   Discussed weight loss over last 2 years. Admits to having to do with her way of controlling things. Has a stressful job and hectic life with 5 children. Denies body image issues. Feels fine. Comes from a thin family. Weight is down 40 pounds in last 2 years. See below.   Recent lithotripsy due to 7mm urethral stone. Now feeling better.   Migraines: stable on topamax. imitrex for abortive.   Assessment  1. Annual physical exam   2. Cervical cancer screening   3. Underweight   4. Nephrolithiasis   5. Migraine without aura and without status migrainosus, not intractable   6. BMI less than 19,adult   7. Cervical polyp      Plan  Female Wellness Visit:  Age appropriate Health Maintenance and Prevention measures were discussed with patient. Included topics are cancer screening recommendations, ways to keep healthy (see AVS) including dietary and exercise recommendations, regular eye and dental care, use of seat belts, and avoidance of moderate alcohol use and tobacco use.   BMI: discussed patient's BMI and encouraged positive lifestyle modifications to help get to or maintain a target BMI. Discussed eating disorder and seeing a eating disorder specialist. To start logging calories. Discussed negative health effects associated with low bmi.  HM needs and immunizations were addressed and ordered. See below for orders. See HM and immunization section for updates.  Routine labs and screening tests ordered including cmp, cbc and lipids where appropriate.  Discussed  recommendations regarding Vit D and calcium supplementation (see AVS)  Follow up: Return for cervical polyp removal.   Orders Placed This Encounter  Procedures  . CBC with Differential/Platelet  . Comprehensive metabolic panel  . Lipid panel  . Prealbumin  . TSH   No orders of the defined types were placed in this encounter.    Lifestyle: Body mass index is 17.53 kg/m. Wt Readings from Last 3 Encounters:  10/21/17 113 lb 9.6 oz (51.5 kg)  09/25/17 113 lb (51.3 kg)  08/21/17 113 lb 9.6 oz (51.5 kg)   Diet: rarely eats a full meal. hard to eat at work: will eat a couple of bananas or a can of soup; doesn't eat breakfast, will nibble while she makes dinner for her family Exercise: intermittently,   Patient Active Problem List   Diagnosis Date Noted  . Nephrolithiasis 10/21/2017  . Underweight 10/21/2017  . BMI less than 19,adult 10/21/2017  . Migraine without aura and without status migrainosus, not intractable 05/27/2016   Health Maintenance  Topic Date Due  . PAP SMEAR  07/29/1989  . INFLUENZA VACCINE  10/16/2017  . HIV Screening  08/20/2018 (Originally 07/30/1983)  . TETANUS/TDAP  10/22/2018 (Originally 07/30/1987)    There is no immunization history on file for this patient. We updated and reviewed the patient's past history in detail and it is documented below. Allergies: Patient is allergic to asa [aspirin]. Past Medical History Patient  has a past medical history of Hepatitis A, History of kidney stones, Migraines, and Pneumonia. Past Surgical History  Patient  has a past surgical history that includes Shoulder arthroscopy (Right); Repair peroneal tendons ankle (Right); and Lithotripsy (09/25/2017). Family History: Patient family history includes Basal cell carcinoma in her father; Healthy in her mother; Hyperlipidemia in her brother and father; Migraines in her brother, father, and sister; Pulmonary embolism in her brother. Social History:  Patient  reports that  she has never smoked. She has never used smokeless tobacco. She reports that she drinks alcohol. She reports that she does not use drugs.  Review of Systems: Constitutional: negative for fever or malaise Ophthalmic: negative for photophobia, double vision or loss of vision Cardiovascular: negative for chest pain, dyspnea on exertion, or new LE swelling Respiratory: negative for SOB or persistent cough Gastrointestinal: negative for abdominal pain, change in bowel habits or melena Genitourinary: negative for dysuria or gross hematuria, no abnormal uterine bleeding or disharge Musculoskeletal: negative for new gait disturbance or muscular weakness Integumentary: negative for new or persistent rashes, no breast lumps Neurological: negative for TIA or stroke symptoms Psychiatric: negative for SI or delusions Allergic/Immunologic: negative for hives  Patient Care Team    Relationship Specialty Notifications Start End  Willow Ora, MD PCP - General Family Medicine  08/19/17    Depression screen [facility] 2/9 08/19/2017 10/09/2014  Decreased Interest 0 0  Down, Depressed, Hopeless 0 0  PHQ - 2 Score 0 0    Objective  weight 05/2016 159 lb, BMI 23.4 Vitals: BP 92/62   Pulse 67   Temp 98 F (36.7 C)   Ht 5' 7.5" (1.715 m)   Wt 113 lb 9.6 oz (51.5 kg)   SpO2 98%   BMI 17.53 kg/m  General:  Well developed, thin, no acute distress  Psych:  Alert and orientedx3,normal mood and affect HEENT:  Normocephalic, atraumatic, non-icteric sclera, PERRL, oropharynx is clear without mass or exudate, supple neck without adenopathy, mass or thyromegaly Cardiovascular:  Normal S1, S2, RRR without gallop, rub or murmur, nondisplaced PMI Respiratory:  Good breath sounds bilaterally, CTAB with normal respiratory effort Gastrointestinal: scaphoid, normal bowel sounds, soft, non-tender, no noted masses. No HSM MSK: no deformities, contusions. Joints are without erythema or swelling. Spine and CVA region are  nontender Skin:  Warm, no rashes or suspicious lesions noted Neurologic:    Mental status is normal. CN 2-11 are normal. Gross motor and sensory exams are normal. Normal gait. No tremor Breast Exam: No mass, skin retraction or nipple discharge is appreciated in either breast. No axillary adenopathy. Fibrocystic changes are not noted Pelvic Exam: Normal external genitalia, no vulvar or vaginal lesions present. Clear cervix w/o CMT. Cervical polyp on stalk present, ectropion. Bimanual exam reveals a nontender fundus w/o masses, nl size. No adnexal masses present. No inguinal adenopathy. A PAP smear was performed.   Commons side effects, risks, benefits, and alternatives for medications and treatment plan prescribed today were discussed, and the patient expressed understanding of the given instructions. Patient is instructed to call or message via MyChart if he/she has any questions or concerns regarding our treatment plan. No barriers to understanding were identified. We discussed Red Flag symptoms and signs in detail. Patient expressed understanding regarding what to do in case of urgent or emergency type symptoms.   Medication list was reconciled, printed and provided to the patient in AVS. Patient instructions and summary information was reviewed with the patient as documented in the AVS. This note was prepared with assistance of Dragon voice recognition software. Occasional wrong-word or sound-a-like substitutions may have occurred  due to the inherent limitations of voice recognition software

## 2017-10-21 NOTE — Patient Instructions (Addendum)
Please return for cervical polyp removal.   Please start logging your calories and consider seeing a therapist who specialized in eating disorders to help you.   You need 1600-2000kcal per day.  Being underweight can affect your health.  Eating Disorders Eating disorders are medical and psychological problems. They often have biological, psychological, and social causes. Depression, obsession with food, and a distorted body image are common in people who have eating disorders. Over time, eating disorders will damage the body and are likely to have a major impact on mood and mental health. The most common eating disorders are:  Bulimia nervosa. This is when you eat large amounts of food in a short period of time. This is often followed by getting rid of the calories that were eaten (purging) by vomiting, exercising excessively, or taking laxatives. Bulimia may start as a way to control weight. Later, it may be triggered by stress or an emotional crisis.  Anorexia nervosa. This is when you have an extremely low body weight from severe dieting or compulsive exercising or both. Losing weight or preventing weight gain becomes an obsession. Anorexia is often used as a way to cope with emotional problems.  Binge eating disorder (BED). This is when you eat an excessive amount of food in a short period of time of two hours or less, and you feel that you have lost control over your eating. This kind of experience is called a binge. People who have BED eat too quickly, feel uncomfortably full, eat when they are not hungry, and usually eat alone. Typically, a binge happens three or more times a week.  Other specified feeding or eating disorder. You may be diagnosed with this if you have some symptoms of BED, bulimia nervosa, or anorexia nervosa, but not enough symptoms to diagnose a specific disorder.  Eating disorders can lead to serious medical problems. These may include:  Extreme malnutrition.  Hormone  imbalance.  Vitamin and mineral deficiencies.  Organ damage.  Obesity and related medical conditions.  Damage to the teeth, jaw, and esophagus.  What are the causes? Eating disorders are often associated with emotional or psychological issues, including:  Depression.  Anxiety.  Low self-esteem.  Loneliness.  Shame.  Extreme self-judgment.  What increases the risk? Eating disorders are more likely to develop in:  Women.  People under the age of 35.  People who have been teased about their weight.  In addition, people who participate in endurance sports or sports where physical appearance is emphasized may be at greater risk of developing an eating disorder. What are the signs or symptoms? Symptoms of an eating disorder include:  An obsession with food and eating.  An obsession with body weight and appearance.  Not eating or barely eating. This can lead to vitamin and mineral deficiencies.  Binge eating.  Vomiting after eating.  Taking laxatives after eating.  Exercising too often.  Distorted body image.  Absence or loss of menstrual flow (amenorrhea), if this applies.  Self-esteem that is dependent on body image or weight.  How is this diagnosed? This condition is diagnosed with a physical exam and a psychological evaluation. You may have blood tests, urine tests, or eating questionnaires. How is this treated? Treatment for an eating disorder may include:  Psychotherapy. This may also be called talk therapy or counseling.  Seeing a nutrition specialist (dietitian).  Getting appropriate exercise.  Medicines to help relieve anxiety or depression.  Hospitalization or referral to an eating disorders program.  Follow  these instructions at home: Lifestyle  Educate yourself and others about your eating disorder.  Identify situations that trigger your symptoms. Develop a plan to help you cope with these situations.  Resist weighing yourself or  checking yourself in the mirror often.  Seek out programs or resources that address eating disorders.  Talk with an eating disorder specialist, therapist, or counselor about your eating behavior. Activity  Follow instructions from your health care provider about eating and exercising.  Return to your normal activities as told by your health care provider. Ask your health care provider what activities are safe for you. General instructions  Get regular dental care every six months.  Keep all follow-up visits as told by your health care provider. This is important.  Take over-the-counter and prescription medicines only as told by your health care provider. Contact a health care provider if:  You have sudden weight loss or gain related to your eating.  Your symptoms return.  You abuse stimulants or diet aids.  You have an irregular heartbeat.  You have a constant fear of gaining weight.  You take laxatives after you eat.  You have eating, dieting, or exercising habits that you cannot control.  You have irregular menstrual periods or you stop having menstrual periods, if this applies. Get help right away if:  You have blood or brown flecks (like coffee grounds) in your vomit.  You have bright red or black stools.  You have chest pain or pressure.  You have difficulty breathing.  You do not urinate every eight hours.  You have serious thoughts about hurting yourself or have plans to do that. This information is not intended to replace advice given to you by your health care provider. Make sure you discuss any questions you have with your health care provider. Document Released: 03/04/2005 Document Revised: 07/14/2015 Document Reviewed: 01/06/2015 Elsevier Interactive Patient Education  [employer]2018 [employer] Inc.   If you have any questions or concerns, please don't hesitate to send me a message via MyChart or call the office at [phone]. Thank you for visiting with us  today! It's our pleasure caring for you.  Please do these things to maintain good health!   Exercise at least 30-45 minutes a day,  4-5 days a week.   Eat a low-fat diet with lots of fruits and vegetables, up to 7-9 servings per day.  Drink plenty of water daily. Try to drink 8 8oz glasses per day.  Seatbelts can save your life. Always wear your seatbelt.  Place Smoke Detectors on every level of your home and check batteries every year.  Schedule an appointment with an eye doctor for an eye exam every 1-2 years  Safe sex - use condoms to protect yourself from STDs if you could be exposed to these types of infections. Use birth control if you do not want to become pregnant and are sexually active.  Avoid heavy alcohol use. If you drink, keep it to less than 2 drinks/day and not every day.  Health Care Power of Attorney.  Choose someone you trust that could speak for you if you became unable to speak for yourself.  Depression is common in our stressful world.If you're feeling down or losing interest in things you normally enjoy, please come in for a visit.  If anyone is threatening or hurting you, please get help. Physical or Emotional Violence is never OK.

## 2017-10-22 LAB — CYTOLOGY - PAP
Diagnosis: NEGATIVE
HPV: NOT DETECTED

## 2017-10-22 LAB — PREALBUMIN: Prealbumin: 18 mg/dL (ref 17–34)

## 2017-10-22 LAB — HIV ANTIBODY (ROUTINE TESTING W REFLEX): HIV 1&2 Ab, 4th Generation: NONREACTIVE

## 2017-10-24 ENCOUNTER — Encounter: Payer: Self-pay | Admitting: Family Medicine

## 2017-10-24 ENCOUNTER — Ambulatory Visit (INDEPENDENT_AMBULATORY_CARE_PROVIDER_SITE_OTHER): Payer: 59 | Admitting: Family Medicine

## 2017-10-24 ENCOUNTER — Encounter (HOSPITAL_COMMUNITY): Payer: Self-pay

## 2017-10-24 ENCOUNTER — Telehealth: Payer: Self-pay | Admitting: Emergency Medicine

## 2017-10-24 ENCOUNTER — Inpatient Hospital Stay (HOSPITAL_COMMUNITY)
Admission: AD | Admit: 2017-10-24 | Discharge: 2017-10-24 | Disposition: A | Discharge status: Home / Self Care | Payer: 59 | Source: Ambulatory Visit | Attending: Obstetrics and Gynecology | Admitting: Obstetrics and Gynecology

## 2017-10-24 ENCOUNTER — Other Ambulatory Visit: Payer: Self-pay

## 2017-10-24 ENCOUNTER — Other Ambulatory Visit (HOSPITAL_COMMUNITY)
Admission: RE | Admit: 2017-10-24 | Discharge: 2017-10-24 | Disposition: A | Discharge status: Home / Self Care | Payer: 59 | Source: Ambulatory Visit | Attending: Family Medicine | Admitting: Family Medicine

## 2017-10-24 VITALS — BP 112/62 | HR 82 | Temp 98.3°F | Ht 67.5 in | Wt 114.8 lb

## 2017-10-24 DIAGNOSIS — N841 Polyp of cervix uteri: Secondary | ICD-10-CM | POA: Insufficient documentation

## 2017-10-24 DIAGNOSIS — Z79899 Other long term (current) drug therapy: Secondary | ICD-10-CM | POA: Diagnosis not present

## 2017-10-24 DIAGNOSIS — Z87442 Personal history of urinary calculi: Secondary | ICD-10-CM | POA: Diagnosis not present

## 2017-10-24 DIAGNOSIS — Z9889 Other specified postprocedural states: Secondary | ICD-10-CM | POA: Insufficient documentation

## 2017-10-24 DIAGNOSIS — N939 Abnormal uterine and vaginal bleeding, unspecified: Secondary | ICD-10-CM | POA: Diagnosis not present

## 2017-10-24 LAB — CBC
HCT: 33.8 % — ABNORMAL LOW (ref 36.0–46.0)
HEMOGLOBIN: 11.5 g/dL — AB (ref 12.0–15.0)
MCH: 29.8 pg (ref 26.0–34.0)
MCHC: 34 g/dL (ref 30.0–36.0)
MCV: 87.6 fL (ref 78.0–100.0)
Platelets: 241 10*3/uL (ref 150–400)
RBC: 3.86 MIL/uL — AB (ref 3.87–5.11)
RDW: 13.1 % (ref 11.5–15.5)
WBC: 7.5 10*3/uL (ref 4.0–10.5)

## 2017-10-24 MED ORDER — TOPIRAMATE 50 MG PO TABS: 50.0000 mg | ORAL_TABLET | Freq: Two times a day (BID) | ORAL | 3 refills | Status: DC

## 2017-10-24 NOTE — MAU Note (Signed)
Urine in lab - preg test negative

## 2017-10-24 NOTE — MAU Provider Note (Signed)
`````  Attestation of Attending Supervision of Advanced Practitioner: Evaluation and management procedures were performed by the PA/NP/CNM/OB Fellow under my supervision/collaboration. Chart reviewed. I have examined the patient, removing 200 cc of fresh blood and clots from vagina. Speculum exam shows pulsatile bleeding from apparent biopsy site on anterior lip of cervix at 12:oo, able to be coagulated x 5 mins with 3 silver nitrate sticks with significant coagulation changes in the tissue. A second group of 3 silver nitrate sticks used again to site with resolution of any active bleeding.  Vagina emptied of blood, will observe x 20 mins and recheck, if no bleeding will d/c home.  IMP: Arterial bleeding s/p cervical biopsy, now coagulated. F/u with Ob Gyn in office next week.  Ashley BurrowJohn V Livingston Charters 10/24/2017 10:12 PM

## 2017-10-24 NOTE — Telephone Encounter (Signed)
See Telephone Encounter  Kathi SimpersAmy Peterman,  LPN

## 2017-10-24 NOTE — MAU Provider Note (Signed)
History   [id number]   Chief Complaint  Patient presents with  . Vaginal Bleeding    HPI Barron Schmidnnette C Linders is a 47 y.o. female  [id number] here with report of increased vaginal bleeding since having a cervical polyp removed in office today.  Reports passing a plum sized clot every 20 minutes prior to arrival.  Denies any pelvic pain or dizziness.  Also informed that cycle started today as well.   Bleeding has decreased since being in MAU and last clot was passed 55 minutes ago.    No LMP recorded. Patient is premenopausal.  OB History  Gravida Para Term Preterm AB Living  5 5 5     5   SAB TAB Ectopic Multiple Live Births               # Outcome Date GA Lbr Len/2nd Weight Sex Delivery Anes PTL Lv  5 Term      Vag-Spont     4 Term      Vag-Spont     3 Term      Vag-Spont     2 Term      Vag-Spont     1 Term      Vag-Spont       Past Medical History:  Diagnosis Date  . Hepatitis A   . History of kidney stones   . Migraines   . Pneumonia     Family History  Problem Relation Age of Onset  . Healthy Mother   . Hyperlipidemia Father   . Migraines Father   . Basal cell carcinoma Father   . Migraines Sister   . Migraines Brother   . Hyperlipidemia Brother   . Pulmonary embolism Brother     Social History   Socioeconomic History  . Marital status: Married    Spouse name: Not on file  . Number of children: 5  . Years of education: Not on file  . Highest education level: Not on file  Occupational History  . 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 Financial resource strain: Not on file  . Food insecurity:    Worry: Not on file    Inability: Not on file  . Transportation needs:    Medical: Not on file    Non-medical: Not on file  Tobacco Use  . Smoking status: Never Smoker  . Smokeless tobacco: Never Used  Substance and Sexual Activity  . Alcohol use: Yes    Alcohol/week: 0.0 standard drinks  . Drug use: Never    . Sexual activity: Yes    Birth control/protection: None  Lifestyle  . Physical activity:    Days per week: Not on file    Minutes per session: Not on file  . Stress: Not on file  Relationships  . Social connections:    Talks on phone: Not on file    Gets together: Not on file    Attends religious service: Not on file    Active member of club or organization: Not on file    Attends meetings of clubs or organizations: Not on file    Relationship status: Not on file  Other Topics Concern  . Not on file  Social History Narrative  . Not on file    Allergies  Allergen Reactions  . Asa [Aspirin] Other (See Comments)    Swelling     No current facility-administered medications on file prior to encounter.  Current Outpatient Medications on File Prior to Encounter  Medication Sig Dispense Refill  . SUMAtriptan (IMITREX) 100 MG tablet TK 1 T PO ONCE PRF MIGRAINE  2  . topiramate (TOPAMAX) 50 MG tablet Take 1 tablet (50 mg total) by mouth 2 (two) times daily. 180 tablet 3     Review of Systems  Constitutional: Negative for chills and fever.  Gastrointestinal: Negative for abdominal pain and vomiting.  Genitourinary: Positive for vaginal bleeding. Negative for menstrual problem, pelvic pain and vaginal pain.  Musculoskeletal: Negative for back pain.  Neurological: Negative for dizziness, syncope and light-headedness.  All other systems reviewed and are negative.    Physical Exam   Vitals:   10/24/17 1748  BP: 105/70  Pulse: 91  Resp: 16  Temp: 98.1 F (36.7 C)  TempSrc: Oral  Weight: 51.3 kg    Physical Exam  Constitutional: She is oriented to person, place, and time. She appears well-developed and well-nourished.  HENT:  Head: Normocephalic.  Neck: Normal range of motion. Neck supple.  Cardiovascular: Normal rate, regular rhythm and normal heart sounds.  Respiratory: Effort normal and breath sounds normal.  GI: Soft. She exhibits no mass. There is  tenderness. There is no guarding.  Genitourinary: There is bleeding (large clot in vaginal vault) in the vagina.    Neurological: She is alert and oriented to person, place, and time. She has normal reflexes.  Skin: Skin is warm and dry.    MAU Course  Procedures Results for orders placed or performed during the hospital encounter of 10/24/17 (from the past 24 hour(s))  CBC     Status: Abnormal   Collection Time: 10/24/17  7:31 PM  Result Value Ref Range   WBC 7.5 4.0 - 10.5 K/uL   RBC 3.86 (L) 3.87 - 5.11 MIL/uL   Hemoglobin 11.5 (L) 12.0 - 15.0 g/dL   HCT 54.0 (L) 98.1 - 19.1 %   MCV 87.6 78.0 - 100.0 fL   MCH 29.8 26.0 - 34.0 pg   MCHC 34.0 30.0 - 36.0 g/dL   RDW 47.8 29.5 - 62.1 %   Platelets 241 150 - 400 K/uL    MDM Dr. Emelda Fear notified to assess patient  2200 Dr Emelda Fear in to examine patient; increased bleeding and clots; silver nitrate applied on cervix; bleeding visually stopped  2230 Pt reassessed; no need bleeding seen on perineum or pad after standing and cough  Assessment and Plan  Post-polyp Removal Bleeding  Plan: Discharge home Bleeding precautions given Follow-up in office next week   Marlis Edelson, CNM 10/24/2017 10:37 PM

## 2017-10-24 NOTE — Discharge Instructions (Signed)
Return if passing of clots or increased vaginal bleeding returns.

## 2017-10-24 NOTE — Progress Notes (Signed)
DR Ashley Livingston in and spec exam done. Used silver nitrate to stop bleeding from small vessel. Pt tol well and pericare done. Will obs about 30mins and have pt get up to BR and see how she does

## 2017-10-24 NOTE — Patient Instructions (Signed)
advil if needed.  You will have some bleeding. Call with any problems or questions.

## 2017-10-24 NOTE — MAU Note (Signed)
Pt had cervical polyp removed this morning by Dr. Mardelle MatteAndy, started bleeding heavily @ 1435, passed large amount of clots, happened again @ 1630.  Advised by MD to come to MAU.  Denies pain.

## 2017-10-24 NOTE — Progress Notes (Signed)
Cervical Polyp removal procedure note:  Dx: cervical polyp Indication: r/o neoplasm, irregular spotting Verbal consent was given. Universal time out done. Pt was placed in dorsal lithotomy position. Speculum was inserted and the cervix was visualized, then cleansed with betadine. The polyp on a stalk was easily visible. Ring forceps were used to grasp the polyp and remove the polyp in 2 attempts. Hemostasis was achieved by gentle pressure and silver nitrate.   Pt tolerated the procedure well.  There were no complications.  Tissue was sent for pathology review.   Pt left in good condition.

## 2017-10-24 NOTE — Telephone Encounter (Signed)
Patient sent my chart message stating that she was passing clots and bleeding heavily since Cervical Polyp Removal. Discussed with Patient and she stated she has soaked through a 10 hr. Pad, at 2:30 and then again and 4:30 with large clots present. Patient stated that she did not have any pain associated with the bleeding. Dr. Mardelle MatteAndy recommended that [facility] Vaginal Bleeding after Cervical Polyp removal. Patient Verbalized understanding and will Proceed [facility].  Kathi SimpersAmy Rosemaria Inabinet,  LPN

## 2017-10-27 NOTE — Progress Notes (Signed)
Please call patient: I have reviewed his/her lab results. Please call pt: her biopsy shows inflamed endocervical polyp w/o any cancer changes. Please ask her how she is feeling since [facility].

## 2017-10-29 ENCOUNTER — Ambulatory Visit (INDEPENDENT_AMBULATORY_CARE_PROVIDER_SITE_OTHER): Payer: 59 | Admitting: Family Medicine

## 2017-10-29 ENCOUNTER — Other Ambulatory Visit: Payer: Self-pay

## 2017-10-29 ENCOUNTER — Encounter: Payer: Self-pay | Admitting: Family Medicine

## 2017-10-29 VITALS — BP 112/82 | HR 76 | Temp 97.7°F | Ht 67.5 in | Wt 113.6 lb

## 2017-10-29 DIAGNOSIS — Z8742 Personal history of other diseases of the female genital tract: Secondary | ICD-10-CM

## 2017-10-29 DIAGNOSIS — Z9889 Other specified postprocedural states: Secondary | ICD-10-CM

## 2017-10-29 DIAGNOSIS — N99820 Postprocedural hemorrhage and hematoma of a genitourinary system organ or structure following a genitourinary system procedure: Secondary | ICD-10-CM | POA: Diagnosis not present

## 2017-10-29 LAB — CBC WITH DIFFERENTIAL/PLATELET
BASOS ABS: 0.1 10*3/uL (ref 0.0–0.1)
Basophils Relative: 2 % (ref 0.0–3.0)
EOS ABS: 0.1 10*3/uL (ref 0.0–0.7)
Eosinophils Relative: 1.2 % (ref 0.0–5.0)
HCT: 31 % — ABNORMAL LOW (ref 36.0–46.0)
Hemoglobin: 10.5 g/dL — ABNORMAL LOW (ref 12.0–15.0)
LYMPHS ABS: 1.6 10*3/uL (ref 0.7–4.0)
LYMPHS PCT: 31.2 % (ref 12.0–46.0)
MCHC: 33.8 g/dL (ref 30.0–36.0)
MCV: 86.8 fl (ref 78.0–100.0)
Monocytes Absolute: 0.3 10*3/uL (ref 0.1–1.0)
Monocytes Relative: 6.4 % (ref 3.0–12.0)
NEUTROS ABS: 3 10*3/uL (ref 1.4–7.7)
Neutrophils Relative %: 59.2 % (ref 43.0–77.0)
PLATELETS: 315 10*3/uL (ref 150.0–400.0)
RBC: 3.57 Mil/uL — AB (ref 3.87–5.11)
RDW: 13.5 % (ref 11.5–15.5)
WBC: 5.1 10*3/uL (ref 4.0–10.5)

## 2017-10-29 NOTE — Progress Notes (Signed)
Subjective  CC:  Chief Complaint  Patient presents with  . Hospitalization Follow-up    was [facility] Heavy Vaginal Bleeidng after Cervical Polyp removal, doing ok. Energy has gotten better    HPI: Ashley Livingston is a 47 y.o. female who presents to the office today to address the problems listed above in the chief complaint.  Unfortunately, Abia had an arterial vaginal bleed at the site of the polypectomy requiring Gyn to evacuate the clots from vagina and use silver nitrate to stop bleeding. Appearently, also had another cervical polyp removed.  She is also on her menses. No more clots. Never had pain. Menses is ending normally. No cp,palpitaitons, sob or fatigue. Eating better, trying to incorporate iron rich foods (wants to avoid iron pills) and gain weight.    Assessment   1. Status post cervical polyp removal   2. Postoperative vaginal bleeding following genitourinary procedure      Plan   Cervical bleed:  Resolved thanks to help of GYN.   Recheck cbc; discussed increasing iron orally.    Follow up: Return if symptoms worsen or fail to improve.   Orders Placed This Encounter  Procedures  . CBC with Differential/Platelet   No orders of the defined types were placed in this encounter.     I reviewed the patients updated PMH, FH, and SocHx.    Patient Active Problem List   Diagnosis Date Noted  . Nephrolithiasis 10/21/2017  . Underweight 10/21/2017  . BMI less than 19,adult 10/21/2017  . Migraine without aura and without status migrainosus, not intractable 05/27/2016   No outpatient medications have been marked as taking for the 10/29/17 encounter (Office Visit) with Willow OraAndy, Deaja Rizo L, MD.    Allergies: Patient is allergic to asa [aspirin]. Family History: Patient family history includes Basal cell carcinoma in her father; Healthy in her mother; Hyperlipidemia in her brother and father; Migraines in her brother, father, and sister; Pulmonary  embolism in her brother. Social History:  Patient  reports that she has never smoked. She has never used smokeless tobacco. She reports that she drinks alcohol. She reports that she does not use drugs.  Review of Systems: Constitutional: Negative for fever malaise or anorexia Cardiovascular: negative for chest pain Respiratory: negative for SOB or persistent cough Gastrointestinal: negative for abdominal pain Wt Readings from Last 3 Encounters:  10/29/17 113 lb 9.6 oz (51.5 kg)  10/24/17 113 lb 1.9 oz (51.3 kg)  10/24/17 114 lb 12.8 oz (52.1 kg)    Objective  Vitals: BP 112/82   Pulse 76   Temp 97.7 F (36.5 C)   Ht 5' 7.5" (1.715 m)   Wt 113 lb 9.6 oz (51.5 kg)   SpO2 96%   BMI 17.53 kg/m  General: no acute distress , A&Ox3, appears well HEENT: PEERL, conjunctiva normal, Oropharynx moist,neck is supple Cardiovascular:  RRR without murmur or gallop.  Respiratory:  Good breath sounds bilaterally, CTAB with normal respiratory effort Skin:  Warm, no rashes     Commons side effects, risks, benefits, and alternatives for medications and treatment plan prescribed today were discussed, and the patient expressed understanding of the given instructions. Patient is instructed to call or message via MyChart if he/she has any questions or concerns regarding our treatment plan. No barriers to understanding were identified. We discussed Red Flag symptoms and signs in detail. Patient expressed understanding regarding what to do in case of urgent or emergency type symptoms.   Medication list was  reconciled, printed and provided to the patient in AVS. Patient instructions and summary information was reviewed with the patient as documented in the AVS. This note was prepared with assistance of Dragon voice recognition software. Occasional wrong-word or sound-a-like substitutions may have occurred due to the inherent limitations of voice recognition software

## 2017-10-29 NOTE — Patient Instructions (Signed)
Iron-Rich Diet  Iron is a mineral that helps your body to produce hemoglobin. Hemoglobin is a protein in your red blood cells that carries oxygen to your body's tissues. Eating too little iron may cause you to feel weak and tired, and it can increase your risk for infection. Eating enough iron is necessary for your body's metabolism, muscle function, and nervous system.  Iron is naturally found in many foods. It can also be added to foods or fortified in foods. There are two types of dietary iron:   Heme iron. Heme iron is absorbed by the body more easily than nonheme iron. Heme iron is found in meat, poultry, and fish.   Nonheme iron. Nonheme iron is found in dietary supplements, iron-fortified grains, beans, and vegetables.    You may need to follow an iron-rich diet if:   You have been diagnosed with iron deficiency or iron-deficiency anemia.   You have a condition that prevents you from absorbing dietary iron, such as:  ? Infection in your intestines.  ? Celiac disease. This involves long-lasting (chronic) inflammation of your intestines.   You do not eat enough iron.   You eat a diet that is high in foods that impair iron absorption.   You have lost a lot of blood.   You have heavy bleeding during your menstrual cycle.   You are pregnant.    What is my plan?  Your health care provider may help you to determine how much iron you need per day based on your condition. Generally, when a person consumes sufficient amounts of iron in the diet, the following iron needs are met:   Men.  ? 14-18 years old: 11 mg per day.  ? 19-50 years old: 8 mg per day.   Women.  ? 14-18 years old: 15 mg per day.  ? 19-50 years old: 18 mg per day.  ? Over 50 years old: 8 mg per day.  ? Pregnant women: 27 mg per day.  ? Breastfeeding women: 9 mg per day.    What do I need to know about an iron-rich diet?   Eat fresh fruits and vegetables that are high in vitamin C along with foods that are high in iron. This will help  increase the amount of iron that your body absorbs from food, especially with foods containing nonheme iron. Foods that are high in vitamin C include oranges, peppers, tomatoes, and mango.   Take iron supplements only as directed by your health care provider. Overdose of iron can be life-threatening. If you were prescribed iron supplements, take them with orange juice or a vitamin C supplement.   Cook foods in pots and pans that are made from iron.   Eat nonheme iron-containing foods alongside foods that are high in heme iron. This helps to improve your iron absorption.   Certain foods and drinks contain compounds that impair iron absorption. Avoid eating these foods in the same meal as iron-rich foods or with iron supplements. These include:  ? Coffee, black tea, and red wine.  ? Milk, dairy products, and foods that are high in calcium.  ? Beans, soybeans, and peas.  ? Whole grains.   When eating foods that contain both nonheme iron and compounds that impair iron absorption, follow these tips to absorb iron better.  ? Soak beans overnight before cooking.  ? Soak whole grains overnight and drain them before using.  ? Ferment flours before baking, such as using yeast in bread dough.    What foods can I eat?  Grains  Iron-fortified breakfast cereal. Iron-fortified whole-wheat bread. Enriched rice. Sprouted grains.  Vegetables  Spinach. Potatoes with skin. Green peas. Broccoli. Red and green bell peppers. Fermented vegetables.  Fruits  Prunes. Raisins. Oranges. Strawberries. Mango. Grapefruit.  Meats and Other Protein Sources  Beef liver. Oysters. Beef. Shrimp. Turkey. Chicken. Tuna. Sardines. Chickpeas. Nuts. Tofu.  Beverages  Tomato juice. Fresh orange juice. Prune juice. Hibiscus tea. Fortified instant breakfast shakes.  Condiments  Tahini. Fermented soy sauce.  Sweets and Desserts  Black-strap molasses.  Other  Wheat germ.  The items listed above may not be a complete list of recommended foods or beverages.  Contact your dietitian for more options.  What foods are not recommended?  Grains  Whole grains. Bran cereal. Bran flour. Oats.  Vegetables  Artichokes. Brussels sprouts. Kale.  Fruits  Blueberries. Raspberries. Strawberries. Figs.  Meats and Other Protein Sources  Soybeans. Products made from soy protein.  Dairy  Milk. Cream. Cheese. Yogurt. Cottage cheese.  Beverages  Coffee. Black tea. Red wine.  Sweets and Desserts  Cocoa. Chocolate. Ice cream.  Other  Basil. Oregano. Parsley.  The items listed above may not be a complete list of foods and beverages to avoid. Contact your dietitian for more information.  This information is not intended to replace advice given to you by your health care provider. Make sure you discuss any questions you have with your health care provider.  Document Released: 10/16/2004 Document Revised: 09/22/2015 Document Reviewed: 09/29/2013  Elsevier Interactive Patient Education  2018 Elsevier Inc[employer]

## 2017-12-08 ENCOUNTER — Encounter: Payer: Self-pay | Admitting: Family Medicine

## 2017-12-08 DIAGNOSIS — M7502 Adhesive capsulitis of left shoulder: Secondary | ICD-10-CM | POA: Insufficient documentation

## 2018-01-05 ENCOUNTER — Encounter (HOSPITAL_COMMUNITY): Payer: Self-pay | Admitting: Urology

## 2018-01-15 ENCOUNTER — Other Ambulatory Visit: Payer: Self-pay | Admitting: Family Medicine

## 2018-01-15 NOTE — Telephone Encounter (Signed)
Requested medication (s) are due for refill today: yes  Requested medication (s) are on the active medication list: yes    Last refill: 08/19/17  Future visit scheduled no  Notes to clinic:Historical Provider  Requested Prescriptions  Pending Prescriptions Disp Refills   SUMAtriptan (IMITREX) 100 MG tablet 10 tablet 2    Sig: May repeat in 2 hours if headache persists or recurs.     Neurology:  Migraine Therapy - Triptan Passed - 01/15/2018  9:07 AM      Passed - Last BP in normal range    BP Readings from Last 1 Encounters:  10/29/17 112/82         Passed - Valid encounter within last 12 months    Recent Outpatient Visits          2 months ago Status post cervical polyp removal   [facility] [place], Malachi Bonds, MD   2 months ago Cervical polyp    [facility] [place], Malachi Bonds, MD   2 months ago Annual physical exam   [facility] [place], Malachi Bonds, MD   4 months ago Nephrolithiasis   [facility] [place], Malachi Bonds, MD   4 months ago Nephrolithiasis   [facility] [place], Malachi Bonds, MD

## 2018-01-15 NOTE — Telephone Encounter (Signed)
Copied from CRM [id number]. Topic: Quick Communication - Rx Refill/Question >> Jan 15, 2018  8:59 AM Crist Infante wrote: Medication: SUMAtriptan (IMITREX) 100 MG tablet  Pt called her pharmacy on Sat and had not heard anything. Pt states she is beginning to get another migraine too.  [id number] Gin[place] Otto, [place] - [address] AT [facility] [address] & [facility] [address] [phone] (Phone) [phone] (Fax)

## 2018-01-16 MED ORDER — SUMATRIPTAN SUCCINATE 100 MG PO TABS
ORAL_TABLET | ORAL | 2 refills | Status: DC
Start: 2018-01-16 — End: 2019-03-26

## 2019-01-29 ENCOUNTER — Encounter: Payer: Self-pay | Admitting: Family Medicine

## 2019-01-29 ENCOUNTER — Other Ambulatory Visit: Payer: Self-pay

## 2019-01-29 ENCOUNTER — Ambulatory Visit (INDEPENDENT_AMBULATORY_CARE_PROVIDER_SITE_OTHER): Payer: BC Managed Care – PPO | Admitting: Family Medicine

## 2019-01-29 VITALS — BP 98/62 | HR 63 | Temp 98.4°F | Ht 67.5 in | Wt 155.2 lb

## 2019-01-29 DIAGNOSIS — G43009 Migraine without aura, not intractable, without status migrainosus: Secondary | ICD-10-CM

## 2019-01-29 MED ORDER — AMITRIPTYLINE HCL 10 MG PO TABS: ORAL_TABLET | ORAL | 2 refills | Status: DC

## 2019-01-29 MED ORDER — RIZATRIPTAN BENZOATE 10 MG PO TBDP: 10.0000 mg | ORAL_TABLET | ORAL | 3 refills | Status: DC | PRN

## 2019-01-29 NOTE — Progress Notes (Signed)
Subjective  CC:  Chief Complaint  Patient presents with  . migraines    HPI: Ashley Livingston is a 48 y.o. female who presents to the office today to address the problems listed above in the chief complaint.  48 yo perimenopausal female with long h/o migraines. Had recurrent migraines several years ago that came under good control with topamax. However, she felt "foggy" on it eventually stopping it about a year ago. She did well until march of this year: now with 2-4 migraines / month w/o clear triggers (happy, stress free, a few food triggers) and harder to treat; imitrex no longer as effective often needing repeat dosing or awakening the next day with headache. Not using other OTC medications. Eating well. No new sxs to her headaches in quality, just in frequency and severity. Gets classic nausea, unilateral throbbing headache with photophobia.   No prior brain imaging nor headache specialist evaluation.    Assessment  1. Migraine without aura and without status migrainosus, not intractable      Plan   Worsening migraines:  Change to maxalt-MLT and start elavil (discussed preventive options including antihypertensives (pt with low bp at baseline), antidepressants like effexor, and new class CGRP antagonists. Will start with low dose elavil and titrate up for effect and as tolerated. Close f/u. Red flags discussed and none identified.   Due for CPE at her convenience as well.   Follow up: Return in about 6 weeks (around 03/12/2019) for recheck headaches. .  Visit date not found  No orders of the defined types were placed in this encounter.  Meds ordered this encounter  Medications  . rizatriptan (MAXALT-MLT) 10 MG disintegrating tablet    Sig: Take 1 tablet (10 mg total) by mouth as needed for migraine. May repeat in 2 hours if needed    Dispense:  10 tablet    Refill:  3  . amitriptyline (ELAVIL) 10 MG tablet    Sig: Take 1 tablet (10 mg total) by mouth at bedtime for 14  days, THEN 2 tablets (20 mg total) at bedtime.    Dispense:  60 tablet    Refill:  2      I reviewed the patients updated PMH, FH, and SocHx.    Patient Active Problem List   Diagnosis Date Noted  . Adhesive capsulitis of left shoulder 12/08/2017  . Nephrolithiasis 10/21/2017  . Migraine without aura and without status migrainosus, not intractable 05/27/2016   Current Meds  Medication Sig  . SUMAtriptan (IMITREX) 100 MG tablet TK 1 T PO ONCE PRF MIGRAINE    Allergies: Patient is allergic to asa [aspirin]. Family History: Patient family history includes Basal cell carcinoma in her father; Healthy in her mother; Hyperlipidemia in her brother and father; Migraines in her brother, father, and sister; Pulmonary embolism in her brother. Social History:  Patient  reports that she has never smoked. She has never used smokeless tobacco. She reports current alcohol use. She reports that she does not use drugs.  Review of Systems: Constitutional: Negative for fever malaise or anorexia Cardiovascular: negative for chest pain Respiratory: negative for SOB or persistent cough Gastrointestinal: negative for abdominal pain  Objective  Vitals: BP 98/62 (BP Location: Left Arm, Patient Position: Sitting, Cuff Size: Normal)   Pulse 63   Temp 98.4 F (36.9 C) (Skin)   Ht 5' 7.5" (1.715 m)   Wt 155 lb 3.2 oz (70.4 kg)   LMP 01/02/2019 (Within Days)   SpO2 96%  BMI 23.95 kg/m  General: no acute distress , A&Ox3 HEENT: PEERL, conjunctiva normal, Oropharynx moist,neck is supple nonfocal neuro exam.     Commons side effects, risks, benefits, and alternatives for medications and treatment plan prescribed today were discussed, and the patient expressed understanding of the given instructions. Patient is instructed to call or message via MyChart if he/she has any questions or concerns regarding our treatment plan. No barriers to understanding were identified. We discussed Red Flag symptoms and  signs in detail. Patient expressed understanding regarding what to do in case of urgent or emergency type symptoms.   Medication list was reconciled, printed and provided to the patient in AVS. Patient instructions and summary information was reviewed with the patient as documented in the AVS. This note was prepared with assistance of Dragon voice recognition software. Occasional wrong-word or sound-a-like substitutions may have occurred due to the inherent limitations of voice recognition software

## 2019-01-29 NOTE — Patient Instructions (Signed)
Please return in 4-8 weeks for migraine recheck.   Please let me know if you have any problems or questions with the new medication.   If you have any questions or concerns, please don't hesitate to send me a message via MyChart or call the office at [phone]. Thank you for visiting with [place] today! It's our pleasure caring for you.   Recurrent Migraine Headache  Migraines are a type of headache, and they are usually stronger and more sudden than normal headaches (tension headaches). Migraines are characterized by an intense pulsing, throbbing pain that is usually only present on one side of the head. Sometimes, migraine headaches can cause nausea, vomiting, sensitivity to light and sound, and vision changes. Recurrent migraines keep coming back (recurring). A migraine can last from 4 hours up to 3 days. What are the causes? The exact cause of this condition is not known. However, a migraine may be caused when nerves in the brain become irritated and release chemicals that cause inflammation of blood vessels. This inflammation causes pain. Certain things may also trigger migraines, such as:  A disruption in your regular eating and sleeping schedule.  Smoking.  Stress.  Menstruation.  Certain foods and drinks, such as: ? Aged cheese. ? Chocolate. ? Alcohol. ? Caffeine. ? Foods or drinks that contain nitrates, glutamate, aspartame, MSG, or tyramine.  Lack of sleep.  Hunger.  Physical exertion.  Fatigue.  High altitude.  Weather changes.  Medicines, such as: ? Nitroglycerin, which is used to treat chest pain. ? Birth control pills. ? Estrogen. ? Some blood pressure medicines. What are the signs or symptoms? Symptoms of this condition vary for each person and may include:  Pain that is usually only present on one side of the head. In some cases, the pain may be on both sides of the head or around the head or neck.  Pulsating or throbbing pain.  Severe pain that  prevents daily activities.  Pain that is aggravated by any physical activity.  Nausea, vomiting, or both.  Dizziness.  Pain with exposure to bright lights, loud noises, or activity.  General sensitivity to bright lights, loud noises, or smells. Before you get a migraine, you may get warning signs that a migraine is coming (aura). An aura may include:  Seeing flashing lights.  Seeing bright spots, halos, or zigzag lines.  Having tunnel vision or blurred vision.  Having numbness or a tingling feeling.  Having trouble talking.  Having muscle weakness.  Smelling a certain odor. How is this diagnosed? This condition is often diagnosed based on:  Your symptoms and medical history.  A physical exam. You may also have tests, including:  A CT scan or MRI of your brain. These imaging tests cannot diagnose migraines, but they can help to rule out other causes of headaches.  Blood tests. How is this treated? This condition is treated with:  Medicines. These are used for: ? Lessening pain and nausea. ? Preventing recurrent migraines.  Lifestyle changes, such as changes to your diet or sleeping patterns.  Behavior therapy, such as relaxation training or biofeedback. Biofeedback is a treatment that involves teaching you to relax and use your brain to lower your heart rate and control your breathing. Follow these instructions at home: Medicines  Take over-the-counter and prescription medicines only as told by your health care provider.  Do not drive or use heavy machinery while taking prescription pain medicine. Lifestyle  Do not use any products that contain nicotine or tobacco, such  as cigarettes and e-cigarettes. If you need help quitting, ask your health care provider.  Limit alcohol intake to no more than 1 drink a day for nonpregnant women and 2 drinks a day for men. One drink equals 12 oz of beer, 5 oz of wine, or 1 oz of hard liquor.  Get 7-9 hours of sleep each  night, or the amount of sleep recommended by your health care provider.  Limit your stress. Talk with your health care provider if you need help with stress management.  Maintain a healthy weight. If you need help losing weight, ask your health care provider.  Exercise regularly. Aim for 150 minutes of moderate-intensity exercise (walking, biking, yoga) or 75 minutes of vigorous exercise (running, circuit training, swimming) each week. General instructions   Keep a journal to find out what triggers your migraine headaches so you can avoid these triggers. For example, write down: ? What you eat and drink. ? How much sleep you get. ? Any change to your diet or medicines.  Lie down in a dark, quiet room when you have a migraine.  Try placing a cool towel over your head when you have a migraine.  Keep lights dim, if bright lights bother you and make your migraines worse.  Keep all follow-up visits as told by your health care provider. This is important. Contact a health care provider if:  Your pain does not improve, even with medicine.  Your migraines continue to return, even with medicine.  You have a fever.  You have weight loss. Get help right away if:  Your migraine becomes severe and medicine does not help.  You have a stiff neck.  You have a loss of vision.  You have muscle weakness or loss of muscle control.  You start losing your balance or have trouble walking.  You feel faint or you pass out.  You develop new, severe symptoms.  You start having abrupt severe headaches that last for a second or less, like a thunderclap. Summary  Migraine headaches are usually stronger and more sudden than normal headaches (tension headaches). Migraines are characterized by an intense pulsing, throbbing pain that is usually only present on one side of the head.  The exact cause of this condition is not known. However, a migraine may be caused when nerves in the brain become  irritated and release chemicals that cause inflammation of blood vessels.  Certain things may trigger migraines, such as changes to diet or sleeping patterns, smoking, certain foods, alcohol, stress, and certain medicines.  Sometimes, migraine headaches can cause nausea, vomiting, sensitivity to light and sound, and vision changes.  Migraines are often diagnosed based on your symptoms, medical history, and a physical exam. This information is not intended to replace advice given to you by your health care provider. Make sure you discuss any questions you have with your health care provider. Document Released: 11/27/2000 Document Revised: 03/07/2017 Document Reviewed: 12/15/2015 Elsevier Patient Education  2020 [employer].

## 2019-03-25 ENCOUNTER — Other Ambulatory Visit: Payer: Self-pay

## 2019-03-26 ENCOUNTER — Encounter: Payer: Self-pay | Admitting: Family Medicine

## 2019-03-26 ENCOUNTER — Ambulatory Visit (INDEPENDENT_AMBULATORY_CARE_PROVIDER_SITE_OTHER): Payer: Self-pay | Admitting: Family Medicine

## 2019-03-26 VITALS — Ht 67.5 in | Wt 155.0 lb

## 2019-03-26 DIAGNOSIS — G43009 Migraine without aura, not intractable, without status migrainosus: Secondary | ICD-10-CM

## 2019-03-26 MED ORDER — AMITRIPTYLINE HCL 10 MG PO TABS: 20.0000 mg | ORAL_TABLET | Freq: Every day | ORAL | 3 refills | Status: DC

## 2019-03-26 NOTE — Progress Notes (Signed)
Virtual Visit via Video Note  Subjective  CC:  Chief Complaint  Patient presents with  . Headache     I connected with RONI FRIBERG on 03/26/19 at 11:20 AM EST by a video enabled telemedicine application and verified that I am speaking with the correct person using two identifiers. Location patient: Home Location provider: [facility] [address], Office Persons participating in the virtual visit: AMARIYAH BAZAR, Willow Ora, MD Gracy Racer, [place]  I discussed the limitations of evaluation and management by telemedicine and the availability of in person appointments. The patient expressed understanding and agreed to proceed. HPI: STORY Ashley Livingston is a 49 y.o. female who was contacted today to address the problems listed above in the chief complaint. . Follow-up for migraine management.  Reviewed last note.  At that time patient was having increased frequency and severity of headaches with unclear triggers.  We reinstituted preventive medicines with Elavil which she is taking nightly and changed from Imitrex to Maxalt.  She has had an excellent response to this.  She has had about 3 headaches in the last 6 weeks.  Maxalt has been abortive completely and 2 of them, 1 moderate headache required a second dose.  She is very happy with this response.  She is tolerating the Elavil nightly well.  It does help her sleep.  She does have some dry mouth side effects.  No new worrisome symptoms. Assessment  1. Migraine without aura and without status migrainosus, not intractable      Plan   Migraines: Good response to preventatives.  Continue Elavil at current dose.  Will monitor over the next 6 to 12 months adjust if needed.  Patient to let me know if increasing frequency or severity of headaches is happening.  Continue Maxalt once or twice monthly for abortive therapies.  Recommend scheduling complete physical at her convenience I discussed the assessment and  treatment plan with the patient. The patient was provided an opportunity to ask questions and all were answered. The patient agreed with the plan and demonstrated an understanding of the instructions.   The patient was advised to call back or seek an in-person evaluation if the symptoms worsen or if the condition fails to improve as anticipated. Follow up: CPE Visit date not found  Meds ordered this encounter  Medications  . amitriptyline (ELAVIL) 10 MG tablet    Sig: Take 2 tablets (20 mg total) by mouth at bedtime.    Dispense:  180 tablet    Refill:  3      I reviewed the patients updated PMH, FH, and SocHx.    Patient Active Problem List   Diagnosis Date Noted  . Adhesive capsulitis of left shoulder 12/08/2017  . Nephrolithiasis 10/21/2017  . Migraine without aura and without status migrainosus, not intractable 05/27/2016   Current Meds  Medication Sig  . amitriptyline (ELAVIL) 10 MG tablet Take 2 tablets (20 mg total) by mouth at bedtime.  . rizatriptan (MAXALT-MLT) 10 MG disintegrating tablet Take 1 tablet (10 mg total) by mouth as needed for migraine. May repeat in 2 hours if needed  . [DISCONTINUED] amitriptyline (ELAVIL) 10 MG tablet Take 1 tablet (10 mg total) by mouth at bedtime for 14 days, THEN 2 tablets (20 mg total) at bedtime.    Allergies: Patient is allergic to asa [aspirin]. Family History: Patient family history includes Basal cell carcinoma in her father; Healthy in her mother; Hyperlipidemia in her  brother and father; Migraines in her brother, father, and sister; Pulmonary embolism in her brother. Social History:  Patient  reports that she has never smoked. She has never used smokeless tobacco. She reports current alcohol use. She reports that she does not use drugs.  Review of Systems: Constitutional: Negative for fever malaise or anorexia Cardiovascular: negative for chest pain Respiratory: negative for SOB or persistent cough Gastrointestinal:  negative for abdominal pain  OBJECTIVE Vitals: Ht 5' 7.5" (1.715 m)   Wt 155 lb (70.3 kg)   LMP 02/19/2019 (Approximate)   BMI 23.92 kg/m  General: no acute distress , A&Ox3  Leamon Arnt, MD

## 2019-03-27 ENCOUNTER — Encounter: Payer: Self-pay | Admitting: Family Medicine

## 2019-03-29 ENCOUNTER — Other Ambulatory Visit: Payer: Self-pay

## 2019-03-29 MED ORDER — AMITRIPTYLINE HCL 10 MG PO TABS: 20.0000 mg | ORAL_TABLET | Freq: Every day | ORAL | 3 refills | Status: DC

## 2019-05-27 ENCOUNTER — Ambulatory Visit: Payer: Self-pay | Attending: Internal Medicine

## 2019-05-27 ENCOUNTER — Telehealth: Payer: Self-pay

## 2019-05-27 DIAGNOSIS — Z23 Encounter for immunization: Secondary | ICD-10-CM

## 2019-05-27 NOTE — Progress Notes (Signed)
   Covid-19 Vaccination Clinic  Name:  DELORES EDELSTEIN    MRN: [id number] DOB: [birth year]  05/27/2019  Ms. Housewright was observed post Covid-19 immunization for 30 minutes based on pre-vaccination screening without incident. She was provided with Vaccine Information Sheet and instruction to access the V-Safe system.   Ms. Lui was instructed to call 911 with any severe reactions post vaccine: Marland Kitchen Difficulty breathing  . Swelling of face and throat  . A fast heartbeat  . A bad rash all over body  . Dizziness and weakness   Immunizations Administered    Name Date Dose VIS Date Route   Pfizer COVID-19 Vaccine 05/27/2019  9:21 AM 0.3 mL 02/26/2019 Intramuscular   Manufacturer: [employer], [employer]   Lot: [id number]   NDC: [address]-6

## 2019-05-27 NOTE — Telephone Encounter (Signed)
Patient husband calling because pt received the pfizer vaccine today at [facility]  and immediately developed a severe headache. Pt husband want to know what medication can pt take. Patient husband states he is at work and will try to get one of the kids to find the paper work to call 1800 number

## 2019-05-27 NOTE — Telephone Encounter (Signed)
Patient states that she woke up with a migraine and has a hx of migraines. She wanted to know if it is ok for her to take rizatriptan since she received the pfizer vaccine. Pt notified that this is ok to take her migraine medication.

## 2019-06-21 ENCOUNTER — Ambulatory Visit: Payer: Self-pay | Attending: Internal Medicine

## 2019-06-21 DIAGNOSIS — Z23 Encounter for immunization: Secondary | ICD-10-CM

## 2019-06-21 NOTE — Progress Notes (Signed)
   Covid-19 Vaccination Clinic  Name:  Ashley Livingston    MRN: [id number] DOB: 1970/08/05  06/21/2019  [name] was observed post Covid-19 immunization for 30 minutes based on pre-vaccination screening without incident. She was provided with Vaccine Information Sheet and instruction to access the V-Safe system.   [name] was instructed to call 911 with any severe reactions post vaccine: Marland Kitchen Difficulty breathing  . Swelling of face and throat  . A fast heartbeat  . A bad rash all over body  . Dizziness and weakness   Immunizations Administered    Name Date Dose VIS Date Route   Pfizer COVID-19 Vaccine 06/21/2019  9:19 AM 0.3 mL 02/26/2019 Intramuscular   Manufacturer: [employer], [employer]   Lot: [id number]   NDC: [address]-9

## 2019-09-09 ENCOUNTER — Encounter: Payer: Self-pay | Admitting: Family Medicine

## 2019-12-07 ENCOUNTER — Other Ambulatory Visit: Payer: Self-pay

## 2019-12-07 ENCOUNTER — Encounter: Payer: Self-pay | Admitting: Family Medicine

## 2019-12-07 ENCOUNTER — Ambulatory Visit (INDEPENDENT_AMBULATORY_CARE_PROVIDER_SITE_OTHER): Payer: PRIVATE HEALTH INSURANCE | Admitting: Family Medicine

## 2019-12-07 VITALS — BP 104/62 | HR 78 | Temp 97.2°F | Resp 15 | Ht 68.0 in | Wt 163.2 lb

## 2019-12-07 DIAGNOSIS — Z1231 Encounter for screening mammogram for malignant neoplasm of breast: Secondary | ICD-10-CM

## 2019-12-07 DIAGNOSIS — N951 Menopausal and female climacteric states: Secondary | ICD-10-CM

## 2019-12-07 DIAGNOSIS — Z1212 Encounter for screening for malignant neoplasm of rectum: Secondary | ICD-10-CM

## 2019-12-07 DIAGNOSIS — G43009 Migraine without aura, not intractable, without status migrainosus: Secondary | ICD-10-CM | POA: Diagnosis not present

## 2019-12-07 DIAGNOSIS — H409 Unspecified glaucoma: Secondary | ICD-10-CM

## 2019-12-07 DIAGNOSIS — Z1211 Encounter for screening for malignant neoplasm of colon: Secondary | ICD-10-CM

## 2019-12-07 MED ORDER — ONDANSETRON 4 MG PO TBDP: 4.0000 mg | ORAL_TABLET | Freq: Three times a day (TID) | ORAL | 0 refills | Status: DC | PRN

## 2019-12-07 MED ORDER — RIZATRIPTAN BENZOATE 10 MG PO TBDP: 10.0000 mg | ORAL_TABLET | ORAL | 3 refills | Status: DC | PRN

## 2019-12-07 NOTE — Progress Notes (Signed)
Subjective  CC:  Chief Complaint  Patient presents with  . Migraine    requesting Maxalt refill     HPI: Ashley Livingston is a 49 y.o. female who presents to the office today to address the problems listed above in the chief complaint.  Migraines: Patient successfully weaned herself off of Elavil 20 mg nightly.  Initially it definitely decrease the frequency of her headaches but she feels she is stable off of it.  She thinks it may have contributed to her weight gain.  She is having 1-2 migraines per month.  They are associated with nausea.  She tends to use caffeine, Maxalt and rest to get over them.  She highly dislikes taking medicines and feels this regimen is working for her.  She can tolerate 1 to 2/month.  She does not want to be on preventatives at this time.  She is hoping that with menopause frequency decreases.  She has no atypical symptoms or headaches.  New diagnosis: Glaucoma.  Having allergic reaction to the eyedrops.  She will call her doctor today.  No vision changes.  Redness and soreness and swollen eyelids for the last 2 days.  Missed menstrual cycle: Has not had a period for several months.  Denies hot flashes.  Has had weight gain and is having trouble taking her weight off.  This is atypical for her.  She is not exercising but has started walking some.  Diet is fair  HM: due mammogram and colonoscopy. Due flu. Overdue for cpe and has scheduled in January.   Wt Readings from Last 3 Encounters:  12/07/19 163 lb 3.2 oz (74 kg)  03/26/19 155 lb (70.3 kg)  01/29/19 155 lb 3.2 oz (70.4 kg)    Assessment  1. Migraine without aura and without status migrainosus, not intractable   2. Encounter for screening mammogram for breast cancer   3. Screening for colorectal cancer   4. Glaucoma of both eyes, unspecified glaucoma type   5. Perimenopausal      Plan   Migraines: Counseling education given.  Patient elects to continue with Maxalt for abortive treatment.   Refilled.  Added Zofran if needed.  Follow-up if worsening.  Glaucoma, with allergic reaction: Follow-up with ophthalmologist.  Updated problems.  We will request records  Premenopausal status: Discussed typical changes.  Monitor for bothersome symptoms.  Recommend strength training exercise.  Reassured, normal BMI.  Health maintenance: He has physical scheduled in January.  Ordered mammogram.  Counseled on colon cancer screening options.  Refer for colonoscopy  Follow up: As scheduled for CPE 03/29/2020  Orders Placed This Encounter  Procedures  . MM DIGITAL SCREENING BILATERAL  . Ambulatory referral to Gastroenterology   Meds ordered this encounter  Medications  . rizatriptan (MAXALT-MLT) 10 MG disintegrating tablet    Sig: Take 1 tablet (10 mg total) by mouth as needed for migraine. May repeat in 2 hours if needed    Dispense:  20 tablet    Refill:  3  . ondansetron (ZOFRAN ODT) 4 MG disintegrating tablet    Sig: Take 1 tablet (4 mg total) by mouth every 8 (eight) hours as needed for nausea or vomiting.    Dispense:  12 tablet    Refill:  0      I reviewed the patients updated PMH, FH, and SocHx.    Patient Active Problem List   Diagnosis Date Noted  . Glaucoma 12/07/2019  . Adhesive capsulitis of left shoulder 12/08/2017  . Nephrolithiasis  10/21/2017  . Migraine without aura and without status migrainosus, not intractable 05/27/2016   Current Meds  Medication Sig  . rizatriptan (MAXALT-MLT) 10 MG disintegrating tablet Take 1 tablet (10 mg total) by mouth as needed for migraine. May repeat in 2 hours if needed  . [DISCONTINUED] rizatriptan (MAXALT-MLT) 10 MG disintegrating tablet Take 1 tablet (10 mg total) by mouth as needed for migraine. May repeat in 2 hours if needed    Allergies: Patient is allergic to asa [aspirin] and brimonidine. Family History: Patient family history includes Basal cell carcinoma in her father; Healthy in her mother; Hyperlipidemia in her  brother and father; Migraines in her brother, father, and sister; Pulmonary embolism in her brother. Social History:  Patient  reports that she has never smoked. She has never used smokeless tobacco. She reports current alcohol use. She reports that she does not use drugs.  Review of Systems: Constitutional: Negative for fever malaise or anorexia Cardiovascular: negative for chest pain Respiratory: negative for SOB or persistent cough Gastrointestinal: negative for abdominal pain  Objective  Vitals: BP 104/62   Pulse 78   Temp (!) 97.2 F (36.2 C) (Temporal)   Resp 15   Ht 5\' 8"  (1.727 m)   Wt 163 lb 3.2 oz (74 kg)   SpO2 99%   BMI 24.81 kg/m  General: no acute distress , A&Ox3 HEENT: PEERL, conjunctiva normal, red swollen eyelids bilaterally neck is supple Cardiovascular:  RRR without murmur or gallop.  Respiratory:  Good breath sounds bilaterally, CTAB with normal respiratory effort Skin:  Warm, no rashes     Commons side effects, risks, benefits, and alternatives for medications and treatment plan prescribed today were discussed, and the patient expressed understanding of the given instructions. Patient is instructed to call or message via MyChart if he/she has any questions or concerns regarding our treatment plan. No barriers to understanding were identified. We discussed Red Flag symptoms and signs in detail. Patient expressed understanding regarding what to do in case of urgent or emergency type symptoms.   Medication list was reconciled, printed and provided to the patient in AVS. Patient instructions and summary information was reviewed with the patient as documented in the AVS. This note was prepared with assistance of Dragon voice recognition software. Occasional wrong-word or sound-a-like substitutions may have occurred due to the inherent limitations of voice recognition software  This visit occurred during the SARS-CoV-2 public health emergency.  Safety protocols were  in place, including screening questions prior to the visit, additional usage of staff PPE, and extensive cleaning of exam room while observing appropriate contact time as indicated for disinfecting solutions.

## 2019-12-07 NOTE — Patient Instructions (Signed)
Please follow up as scheduled for your next visit with me: 03/29/2020   If you have any questions or concerns, please don't hesitate to send me a message via MyChart or call the office at [phone]. Thank you for visiting with [place] today! It's our pleasure caring for you.  I have ordered a mammogram and/or bone density for you as we discussed today: [x]   Mammogram  []   Bone Density  Please call the office checked below to schedule your appointment: Your appointment will at the following location  [x]   [facility]      [address] [place],        [address] Wing         []   [facility]  [address] [place],  [facility]  We will also call you to get you scheduled with a GI doctor for your colonoscopy.

## 2019-12-10 ENCOUNTER — Other Ambulatory Visit: Payer: Self-pay

## 2019-12-10 ENCOUNTER — Ambulatory Visit
Admission: RE | Admit: 2019-12-10 | Discharge: 2019-12-10 | Disposition: A | Discharge status: Home / Self Care | Payer: PRIVATE HEALTH INSURANCE | Source: Ambulatory Visit | Attending: Family Medicine | Admitting: Family Medicine

## 2019-12-10 DIAGNOSIS — Z1231 Encounter for screening mammogram for malignant neoplasm of breast: Secondary | ICD-10-CM

## 2020-01-27 ENCOUNTER — Ambulatory Visit (INDEPENDENT_AMBULATORY_CARE_PROVIDER_SITE_OTHER): Payer: PRIVATE HEALTH INSURANCE | Admitting: Family Medicine

## 2020-01-27 ENCOUNTER — Other Ambulatory Visit: Payer: Self-pay

## 2020-01-27 ENCOUNTER — Encounter: Payer: Self-pay | Admitting: Family Medicine

## 2020-01-27 VITALS — BP 104/76 | HR 56 | Temp 98.3°F | Ht 68.0 in | Wt 149.4 lb

## 2020-01-27 DIAGNOSIS — Z1159 Encounter for screening for other viral diseases: Secondary | ICD-10-CM

## 2020-01-27 DIAGNOSIS — Z0000 Encounter for general adult medical examination without abnormal findings: Secondary | ICD-10-CM

## 2020-01-27 DIAGNOSIS — G43009 Migraine without aura, not intractable, without status migrainosus: Secondary | ICD-10-CM

## 2020-01-27 DIAGNOSIS — N951 Menopausal and female climacteric states: Secondary | ICD-10-CM

## 2020-01-27 DIAGNOSIS — Z23 Encounter for immunization: Secondary | ICD-10-CM | POA: Diagnosis not present

## 2020-01-27 NOTE — Patient Instructions (Signed)
Please return in 12 months for your annual complete physical; please come fasting.  I will release your lab results to you on your MyChart account with further instructions. Please reply with any questions.   Schedule your colonoscopy when you can.  Today you were given your tdap vaccination.    If you have any questions or concerns, please don't hesitate to send me a message via MyChart or call the office at [phone]. Thank you for visiting with Ashley Livingston today! It's our pleasure caring for you.   Preventive Care 19-51 Years Old, Female Preventive care refers to visits with your health care provider and lifestyle choices that can promote health and wellness. This includes:  A yearly physical exam. This may also be called an annual well check.  Regular dental visits and eye exams.  Immunizations.  Screening for certain conditions.  Healthy lifestyle choices, such as eating a healthy diet, getting regular exercise, not using drugs or products that contain nicotine and tobacco, and limiting alcohol use. What can I expect for my preventive care visit? Physical exam Your health care provider will check your:  Height and weight. This may be used to calculate body mass index (BMI), which tells if you are at a healthy weight.  Heart rate and blood pressure.  Skin for abnormal spots. Counseling Your health care provider may ask you questions about your:  Alcohol, tobacco, and drug use.  Emotional well-being.  Home and relationship well-being.  Sexual activity.  Eating habits.  Work and work Astronomer.  Method of birth control.  Menstrual cycle.  Pregnancy history. What immunizations do I need?  Influenza (flu) vaccine  This is recommended every year. Tetanus, diphtheria, and pertussis (Tdap) vaccine  You may need a Td booster every 10 years. Varicella (chickenpox) vaccine  You may need this if you have not been vaccinated. Zoster (shingles) vaccine  You may  need this after age 46. Measles, mumps, and rubella (MMR) vaccine  You may need at least one dose of MMR if you were born in 1957 or later. You may also need a second dose. Pneumococcal conjugate (PCV13) vaccine  You may need this if you have certain conditions and were not previously vaccinated. Pneumococcal polysaccharide (PPSV23) vaccine  You may need one or two doses if you smoke cigarettes or if you have certain conditions. Meningococcal conjugate (MenACWY) vaccine  You may need this if you have certain conditions. Hepatitis A vaccine  You may need this if you have certain conditions or if you travel or work in places where you may be exposed to hepatitis A. Hepatitis B vaccine  You may need this if you have certain conditions or if you travel or work in places where you may be exposed to hepatitis B. Haemophilus influenzae type b (Hib) vaccine  You may need this if you have certain conditions. Human papillomavirus (HPV) vaccine  If recommended by your health care provider, you may need three doses over 6 months. You may receive vaccines as individual doses or as more than one vaccine together in one shot (combination vaccines). Talk with your health care provider about the risks and benefits of combination vaccines. What tests do I need? Blood tests  Lipid and cholesterol levels. These may be checked every 5 years, or more frequently if you are over 68 years old.  Hepatitis C test.  Hepatitis B test. Screening  Lung cancer screening. You may have this screening every year starting at age 64 if you have a 30-pack-year  history of smoking and currently smoke or have quit within the past 15 years.  Colorectal cancer screening. All adults should have this screening starting at age 49 and continuing until age 22. Your health care provider may recommend screening at age 67 if you are at increased risk. You will have tests every 1-10 years, depending on your results and the type  of screening test.  Diabetes screening. This is done by checking your blood sugar (glucose) after you have not eaten for a while (fasting). You may have this done every 1-3 years.  Mammogram. This may be done every 1-2 years. Talk with your health care provider about when you should start having regular mammograms. This may depend on whether you have a family history of breast cancer.  BRCA-related cancer screening. This may be done if you have a family history of breast, ovarian, tubal, or peritoneal cancers.  Pelvic exam and Pap test. This may be done every 3 years starting at age 19. Starting at age 22, this may be done every 5 years if you have a Pap test in combination with an HPV test. Other tests  Sexually transmitted disease (STD) testing.  Bone density scan. This is done to screen for osteoporosis. You may have this scan if you are at high risk for osteoporosis. Follow these instructions at home: Eating and drinking  Eat a diet that includes fresh fruits and vegetables, whole grains, lean protein, and low-fat dairy.  Take vitamin and mineral supplements as recommended by your health care provider.  Do not drink alcohol if: ? Your health care provider tells you not to drink. ? You are pregnant, may be pregnant, or are planning to become pregnant.  If you drink alcohol: ? Limit how much you have to 0-1 drink a day. ? Be aware of how much alcohol is in your drink. In the [place] one drink equals one 12 oz bottle of beer (355 mL), one 5 oz glass of wine (148 mL), or one 1 oz glass of hard liquor (44 mL). Lifestyle  Take daily care of your teeth and gums.  Stay active. Exercise for at least 30 minutes on 5 or more days each week.  Do not use any products that contain nicotine or tobacco, such as cigarettes, e-cigarettes, and chewing tobacco. If you need help quitting, ask your health care provider.  If you are sexually active, practice safe sex. Use a condom or other form of  birth control (contraception) in order to prevent pregnancy and STIs (sexually transmitted infections).  If told by your health care provider, take low-dose aspirin daily starting at age 72. What's next?  Visit your health care provider once a year for a well check visit.  Ask your health care provider how often you should have your eyes and teeth checked.  Stay up to date on all vaccines. This information is not intended to replace advice given to you by your health care provider. Make sure you discuss any questions you have with your health care provider. Document Revised: 11/13/2017 Document Reviewed: 11/13/2017 Elsevier Patient Education  2020 [employer].

## 2020-01-27 NOTE — Progress Notes (Signed)
Subjective  Chief Complaint  Patient presents with  . Annual Exam    fasting    HPI: Ashley Livingston is a 49 y.o. female who presents to [facility] [place] today for a Female Wellness Visit. She also has the concerns and/or needs as listed above in the chief complaint. These will be addressed in addition to the Health Maintenance Visit.   Wellness Visit: annual visit with health maintenance review and exam without Pap   HM: due crc screen. Declines flu vaccine. Declines breast exam. Due tdap Chronic disease f/u and/or acute problem visit: (deemed necessary to be done in addition to the wellness visit):  Migraines: has been migraine free for some time. Exercising regularly and thinks that may be helping. Also perimenopausal with LMP January and very light spotting month.  Perimenopausal: hot flushes have decreased. Sleep is good.    Assessment  1. Annual physical exam   2. Need for hepatitis C screening test   3. Migraine without aura and without status migrainosus, not intractable   4. Perimenopausal       Plan  Female Wellness Visit:  Age appropriate Health Maintenance and Prevention measures were discussed with patient. Included topics are cancer screening recommendations, ways to keep healthy (see AVS) including dietary and exercise recommendations, regular eye and dental care, use of seat belts, and avoidance of moderate alcohol use and tobacco use. Will schedule colonoscopy. mammo up to date  BMI: discussed patient's BMI and encouraged positive lifestyle modifications to help get to or maintain a target BMI.  HM needs and immunizations were addressed and ordered. See below for orders. See HM and immunization section for updates.  Routine labs and screening tests ordered including cmp, cbc and lipids where appropriate.  Discussed recommendations regarding Vit D and calcium supplementation (see AVS)  Chronic disease management visit and/or acute  problem visit:  Migraines are improved. Monitor   Perimenopause: improved sxs.   Follow up: Return in about 1 year (around 01/26/2021) for complete physical.  Orders Placed This Encounter  Procedures  . CBC with Differential/Platelet  . COMPLETE METABOLIC PANEL WITH GFR  . Lipid panel  . TSH  . Hepatitis C antibody   No orders of the defined types were placed in this encounter.     Lifestyle: Body mass index is 22.72 kg/m. Wt Readings from Last 3 Encounters:  01/27/20 149 lb 6.4 oz (67.8 kg)  12/07/19 163 lb 3.2 oz (74 kg)  03/26/19 155 lb (70.3 kg)    Patient Active Problem List   Diagnosis Date Noted  . Glaucoma 12/07/2019  . Adhesive capsulitis of left shoulder 12/08/2017  . Nephrolithiasis 10/21/2017  . Migraine without aura and without status migrainosus, not intractable 05/27/2016   Health Maintenance  Topic Date Due  . Hepatitis C Screening  Never done  . COLONOSCOPY  Never done  . TETANUS/TDAP  02/06/2020 (Originally 07/30/1987)  . INFLUENZA VACCINE  06/15/2020 (Originally 10/17/2019)  . MAMMOGRAM  12/09/2020  . PAP SMEAR-Modifier  10/22/2022  . COVID-19 Vaccine  Completed  . HIV Screening  Completed   Immunization History  Administered Date(s) Administered  . PFIZER SARS-COV-2 Vaccination 05/27/2019, 06/21/2019   We updated and reviewed the patient's past history in detail and it is documented below. Allergies: Patient is allergic to asa [aspirin] and other. Past Medical History Patient  has a past medical history of Glaucoma, Hepatitis A, History of kidney stones, Migraines, and Pneumonia. Past Surgical History Patient  has a  past surgical history that includes Shoulder arthroscopy (Right); Repair peroneal tendons ankle (Right); Lithotripsy (09/25/2017); Cervical polypectomy; and Extracorporeal shock wave lithotripsy (Left, 09/25/2017). Family History: Patient family history includes Basal cell carcinoma in her father; Healthy in her mother;  Hyperlipidemia in her brother and father; Migraines in her brother, father, and sister; Pulmonary embolism in her brother. Social History:  Patient  reports that she has never smoked. She has never used smokeless tobacco. She reports current alcohol use. She reports that she does not use drugs.  Review of Systems: Constitutional: negative for fever or malaise Ophthalmic: negative for photophobia, double vision or loss of vision Cardiovascular: negative for chest pain, dyspnea on exertion, or new LE swelling Respiratory: negative for SOB or persistent cough Gastrointestinal: negative for abdominal pain, change in bowel habits or melena Genitourinary: negative for dysuria or gross hematuria, no abnormal uterine bleeding or disharge Musculoskeletal: negative for new gait disturbance or muscular weakness Integumentary: negative for new or persistent rashes, no breast lumps Neurological: negative for TIA or stroke symptoms Psychiatric: negative for SI or delusions Allergic/Immunologic: negative for hives  Patient Care Team    Relationship Specialty Notifications Start End  Willow Ora, MD PCP - General Family Medicine  08/19/17     Objective  Vitals: BP 104/76   Pulse (!) 56   Temp 98.3 F (36.8 C) (Temporal)   Ht 5\' 8"  (1.727 m)   Wt 149 lb 6.4 oz (67.8 kg)   SpO2 99%   BMI 22.72 kg/m  General:  Well developed, well nourished, no acute distress  Psych:  Alert and orientedx3,normal mood and affect HEENT:  Normocephalic, atraumatic, non-icteric sclera,  supple neck without adenopathy, mass or thyromegaly Cardiovascular:  Normal S1, S2, RRR without gallop, rub or murmur Respiratory:  Good breath sounds bilaterally, CTAB with normal respiratory effort Gastrointestinal: normal bowel sounds, soft, non-tender, no noted masses. No HSM MSK: no deformities, contusions. Joints are without erythema or swelling.  Skin:  Warm, no rashes or suspicious lesions noted Neurologic:    Mental status  is normal. CN 2-11 are normal. Gross motor and sensory exams are normal. Normal gait. No tremor Breast Exam: pt declined   Commons side effects, risks, benefits, and alternatives for medications and treatment plan prescribed today were discussed, and the patient expressed understanding of the given instructions. Patient is instructed to call or message via MyChart if he/she has any questions or concerns regarding our treatment plan. No barriers to understanding were identified. We discussed Red Flag symptoms and signs in detail. Patient expressed understanding regarding what to do in case of urgent or emergency type symptoms.   Medication list was reconciled, printed and provided to the patient in AVS. Patient instructions and summary information was reviewed with the patient as documented in the AVS. This note was prepared with assistance of Dragon voice recognition software. Occasional wrong-word or sound-a-like substitutions may have occurred due to the inherent limitations of voice recognition software  This visit occurred during the SARS-CoV-2 public health emergency.  Safety protocols were in place, including screening questions prior to the visit, additional usage of staff PPE, and extensive cleaning of exam room while observing appropriate contact time as indicated for disinfecting solutions.

## 2020-01-28 LAB — CBC WITH DIFFERENTIAL/PLATELET
Absolute Monocytes: 331 cells/uL (ref 200–950)
Basophils Absolute: 68 cells/uL (ref 0–200)
Basophils Relative: 1.8 %
Eosinophils Absolute: 30 cells/uL (ref 15–500)
Eosinophils Relative: 0.8 %
HCT: 38.9 % (ref 35.0–45.0)
Hemoglobin: 13.2 g/dL (ref 11.7–15.5)
Lymphs Abs: 1425 cells/uL (ref 850–3900)
MCH: 29.6 pg (ref 27.0–33.0)
MCHC: 33.9 g/dL (ref 32.0–36.0)
MCV: 87.2 fL (ref 80.0–100.0)
MPV: 11.5 fL (ref 7.5–12.5)
Monocytes Relative: 8.7 %
Neutro Abs: 1946 cells/uL (ref 1500–7800)
Neutrophils Relative %: 51.2 %
Platelets: 244 10*3/uL (ref 140–400)
RBC: 4.46 10*6/uL (ref 3.80–5.10)
RDW: 13 % (ref 11.0–15.0)
Total Lymphocyte: 37.5 %
WBC: 3.8 10*3/uL (ref 3.8–10.8)

## 2020-01-28 LAB — COMPLETE METABOLIC PANEL WITH GFR
AG Ratio: 1.6 (calc) (ref 1.0–2.5)
ALT: 14 U/L (ref 6–29)
AST: 20 U/L (ref 10–35)
Albumin: 4.4 g/dL (ref 3.6–5.1)
Alkaline phosphatase (APISO): 84 U/L (ref 37–153)
BUN: 10 mg/dL (ref 7–25)
CO2: 25 mmol/L (ref 20–32)
Calcium: 9.3 mg/dL (ref 8.6–10.4)
Chloride: 104 mmol/L (ref 98–110)
Creat: 0.69 mg/dL (ref 0.50–1.05)
GFR, Est African American: 117 mL/min/{1.73_m2} (ref >=60)
GFR, Est Non African American: 101 mL/min/{1.73_m2} (ref >=60)
Globulin: 2.8 g/dL (calc) (ref 1.9–3.7)
Glucose, Bld: 101 mg/dL — ABNORMAL HIGH (ref 65–99)
Potassium: 4.1 mmol/L (ref 3.5–5.3)
Sodium: 139 mmol/L (ref 135–146)
Total Bilirubin: 0.6 mg/dL (ref 0.2–1.2)
Total Protein: 7.2 g/dL (ref 6.1–8.1)

## 2020-01-28 LAB — LIPID PANEL
Cholesterol: 229 mg/dL — ABNORMAL HIGH (ref <200)
HDL: 73 mg/dL (ref >=50)
LDL Cholesterol (Calc): 140 mg/dL (calc) — ABNORMAL HIGH
Non-HDL Cholesterol (Calc): 156 mg/dL (calc) — ABNORMAL HIGH (ref <130)
Total CHOL/HDL Ratio: 3.1 (calc) (ref <5.0)
Triglycerides: 64 mg/dL (ref <150)

## 2020-01-28 LAB — HEPATITIS C ANTIBODY
Hepatitis C Ab: NONREACTIVE
SIGNAL TO CUT-OFF: 0 (ref <1.00)

## 2020-01-28 LAB — TSH: TSH: 2 mIU/L

## 2020-03-29 ENCOUNTER — Encounter: Payer: Self-pay | Admitting: Family Medicine

## 2020-09-11 ENCOUNTER — Other Ambulatory Visit: Payer: Self-pay

## 2020-09-11 ENCOUNTER — Ambulatory Visit (INDEPENDENT_AMBULATORY_CARE_PROVIDER_SITE_OTHER): Payer: No Typology Code available for payment source | Admitting: Physician Assistant

## 2020-09-11 ENCOUNTER — Encounter: Payer: Self-pay | Admitting: Physician Assistant

## 2020-09-11 VITALS — BP 116/75 | HR 82 | Temp 98.2°F | Ht 68.0 in | Wt 133.5 lb

## 2020-09-11 DIAGNOSIS — R319 Hematuria, unspecified: Secondary | ICD-10-CM | POA: Diagnosis not present

## 2020-09-11 LAB — POC URINALSYSI DIPSTICK (AUTOMATED)
Bilirubin, UA: NEGATIVE
Glucose, UA: NEGATIVE
Ketones, UA: NEGATIVE
Leukocytes, UA: NEGATIVE
Nitrite, UA: NEGATIVE
Protein, UA: NEGATIVE
Spec Grav, UA: >=1.03 — AB (ref 1.010–1.025)
Urobilinogen, UA: 0.2 E.U./dL
pH, UA: 5.5 (ref 5.0–8.0)

## 2020-09-11 NOTE — Progress Notes (Signed)
Acute Office Visit  Subjective:    Patient ID: Ashley Livingston, female    DOB: Sep 27, 1970, 50 y.o.   MRN: [id number]  Chief Complaint  Patient presents with   Hematuria    HPI Patient is in today for acute hematuria.  Patient states that over the weekend 2 days ago she was walking her usual 7 miles daily and did start to jog this day.  When she got back to the house she was alarmed by right red blood in the toilet.  She put a tampon in and this did not have any blood on it.  She says she has been tired but denies any fever, chills, abdominal pressure pain, flank pain, dysuria, or other symptoms. Hx of one kidney stone previously in 2019 that required lithotripsy.   Past Medical History:  Diagnosis Date   Glaucoma    Hepatitis A    History of kidney stones    Migraines    Pneumonia     Past Surgical History:  Procedure Laterality Date   CERVICAL POLYPECTOMY     EXTRACORPOREAL SHOCK WAVE LITHOTRIPSY Left 09/25/2017   Procedure: LEFT EXTRACORPOREAL SHOCK WAVE LITHOTRIPSY (ESWL);  Surgeon: Malen Gauze, MD;  Location: [facility];  Service: Urology;  Laterality: Left;   LITHOTRIPSY  09/25/2017   REPAIR PERONEAL TENDONS ANKLE Right    SHOULDER ARTHROSCOPY Right    Labral repair    Family History  Problem Relation Age of Onset   Healthy Mother    Hyperlipidemia Father    Migraines Father    Basal cell carcinoma Father    Migraines Sister    Migraines Brother    Hyperlipidemia Brother    Pulmonary embolism Brother     Social History   Socioeconomic History   Marital status: Married    Spouse name: Not on file   Number of children: 5   Years of education: Not on file   Highest education level: Not on file  Occupational History   Occupation: Conservator, museum/gallery: [employer]  Tobacco Use   Smoking status: Never   Smokeless tobacco: Never  Vaping Use   Vaping Use: Never used  Substance and Sexual Activity   Alcohol use: Yes     Alcohol/week: 0.0 standard drinks   Drug use: Never   Sexual activity: Yes    Birth control/protection: None  Other Topics Concern   Not on file  Social History Narrative   Not on file   Social Determinants of Health   Financial Resource Strain: Not on file  Food Insecurity: Not on file  Transportation Needs: Not on file  Physical Activity: Not on file  Stress: Not on file  Social Connections: Not on file  Intimate Partner Violence: Not on file    Outpatient Medications Prior to Visit  Medication Sig Dispense Refill   dorzolamide-timolol (COSOPT) 22.3-6.8 MG/ML ophthalmic solution 1 drop 2 (two) times daily.     rizatriptan (MAXALT-MLT) 10 MG disintegrating tablet Take 1 tablet (10 mg total) by mouth as needed for migraine. May repeat in 2 hours if needed 20 tablet 3   Tacrolimus 0.1 % CREA      No facility-administered medications prior to visit.    Allergies  Allergen Reactions   Asa [Aspirin] Other (See Comments)    Swelling    Other Itching, Swelling and Other (See Comments)    Preservatives in eyedrops     Review of Systems  Constitutional:  Negative for fever.  Genitourinary:  Positive for hematuria. Negative for decreased urine volume, difficulty urinating, dysuria, flank pain, frequency, urgency, vaginal bleeding, vaginal discharge and vaginal pain.      Objective:    Physical Exam Vitals and nursing note reviewed.  Constitutional:      General: She is not in acute distress.    Appearance: Normal appearance. She is not ill-appearing.  HENT:     Head: Normocephalic and atraumatic.  Cardiovascular:     Rate and Rhythm: Normal rate and regular rhythm.     Pulses: Normal pulses.     Heart sounds: Normal heart sounds.  Pulmonary:     Effort: Pulmonary effort is normal.     Breath sounds: Normal breath sounds.  Abdominal:     General: Abdomen is flat. Bowel sounds are normal.     Palpations: Abdomen is soft.     Tenderness: There is no right CVA  tenderness or left CVA tenderness.  Skin:    General: Skin is warm and dry.  Neurological:     General: No focal deficit present.     Mental Status: She is alert.  Psychiatric:        Mood and Affect: Mood normal.    BP 116/75   Pulse 82   Temp 98.2 F (36.8 C)   Ht 5\' 8"  (1.727 m)   Wt 133 lb 8 oz (60.6 kg)   SpO2 97%   BMI 20.30 kg/m  Wt Readings from Last 3 Encounters:  09/11/20 133 lb 8 oz (60.6 kg)  01/27/20 149 lb 6.4 oz (67.8 kg)  12/07/19 163 lb 3.2 oz (74 kg)    Health Maintenance Due  Topic Date Due   COLONOSCOPY (Pts 45-72yrs Insurance coverage will need to be confirmed)  Never done   Zoster Vaccines- Shingrix (1 of 2) Never done   COVID-19 Vaccine (4 - Booster for Pfizer series) 05/05/2020    There are no preventive care reminders to display for this patient.   Lab Results  Component Value Date   TSH 2.00 01/27/2020   Lab Results  Component Value Date   WBC 3.8 01/27/2020   HGB 13.2 01/27/2020   HCT 38.9 01/27/2020   MCV 87.2 01/27/2020   PLT 244 01/27/2020   Lab Results  Component Value Date   NA 139 01/27/2020   K 4.1 01/27/2020   CO2 25 01/27/2020   GLUCOSE 101 (H) 01/27/2020   BUN 10 01/27/2020   CREATININE 0.69 01/27/2020   BILITOT 0.6 01/27/2020   ALKPHOS 109 10/21/2017   AST 20 01/27/2020   ALT 14 01/27/2020   PROT 7.2 01/27/2020   ALBUMIN 4.4 10/21/2017   CALCIUM 9.3 01/27/2020   GFR 106.65 10/21/2017   Lab Results  Component Value Date   CHOL 229 (H) 01/27/2020   Lab Results  Component Value Date   HDL 73 01/27/2020   Lab Results  Component Value Date   LDLCALC 140 (H) 01/27/2020   Lab Results  Component Value Date   TRIG 64 01/27/2020   Lab Results  Component Value Date   CHOLHDL 3.1 01/27/2020   No results found for: HGBA1C     Assessment & Plan:   Problem List Items Addressed This Visit   None Visit Diagnoses     Hematuria, unspecified type    -  Primary   Relevant Orders   POCT Urinalysis Dipstick  (Automated) (Completed)   Urine Culture      1. Hematuria, unspecified type  Microscopic blood noted on urinalysis, but patient states that she did not see any bright red blood like she did yesterday.  She denies any symptoms, we will send for culture to ensure there is no infection.  Pending negative urine culture, I do recommend that she recheck a urinalysis in a few weeks to ensure resolution.  This very well have been caused by her vigorous exercise or she may have passed another kidney stone.  She is going to keep me updated on how she is doing.  We will continue to push fluids.  This note was prepared with assistance of Conservation officer, historic buildings. Occasional wrong-word or sound-a-like substitutions may have occurred due to the inherent limitations of voice recognition software.   Cambell Stanek M Clarie Camey, PA-C

## 2020-09-12 LAB — URINE CULTURE
MICRO NUMBER:: 12053928
SPECIMEN QUALITY:: ADEQUATE

## 2021-01-29 ENCOUNTER — Encounter: Payer: Self-pay | Admitting: Family Medicine

## 2021-01-29 ENCOUNTER — Other Ambulatory Visit: Payer: Self-pay

## 2021-01-29 ENCOUNTER — Ambulatory Visit (INDEPENDENT_AMBULATORY_CARE_PROVIDER_SITE_OTHER): Payer: No Typology Code available for payment source | Admitting: Family Medicine

## 2021-01-29 VITALS — BP 100/78 | HR 54 | Temp 98.2°F | Ht 68.0 in | Wt 142.6 lb

## 2021-01-29 DIAGNOSIS — Z780 Asymptomatic menopausal state: Secondary | ICD-10-CM

## 2021-01-29 DIAGNOSIS — Z23 Encounter for immunization: Secondary | ICD-10-CM | POA: Diagnosis not present

## 2021-01-29 DIAGNOSIS — Z0000 Encounter for general adult medical examination without abnormal findings: Secondary | ICD-10-CM | POA: Diagnosis not present

## 2021-01-29 DIAGNOSIS — H40213 Acute angle-closure glaucoma, bilateral: Secondary | ICD-10-CM

## 2021-01-29 DIAGNOSIS — N200 Calculus of kidney: Secondary | ICD-10-CM

## 2021-01-29 DIAGNOSIS — Z1211 Encounter for screening for malignant neoplasm of colon: Secondary | ICD-10-CM

## 2021-01-29 DIAGNOSIS — G43009 Migraine without aura, not intractable, without status migrainosus: Secondary | ICD-10-CM | POA: Diagnosis not present

## 2021-01-29 DIAGNOSIS — Z1212 Encounter for screening for malignant neoplasm of rectum: Secondary | ICD-10-CM

## 2021-01-29 DIAGNOSIS — H40219 Acute angle-closure glaucoma, unspecified eye: Secondary | ICD-10-CM | POA: Insufficient documentation

## 2021-01-29 LAB — LIPID PANEL
Cholesterol: 266 mg/dL — ABNORMAL HIGH (ref 0–200)
HDL: 88.8 mg/dL (ref >39.00)
LDL Cholesterol: 164 mg/dL — ABNORMAL HIGH (ref 0–99)
NonHDL: 176.89
Total CHOL/HDL Ratio: 3
Triglycerides: 64 mg/dL (ref 0.0–149.0)
VLDL: 12.8 mg/dL (ref 0.0–40.0)

## 2021-01-29 LAB — CBC WITH DIFFERENTIAL/PLATELET
Basophils Absolute: 0.1 10*3/uL (ref 0.0–0.1)
Basophils Relative: 2.3 % (ref 0.0–3.0)
Eosinophils Absolute: 0.1 10*3/uL (ref 0.0–0.7)
Eosinophils Relative: 1.8 % (ref 0.0–5.0)
HCT: 41.9 % (ref 36.0–46.0)
Hemoglobin: 14.1 g/dL (ref 12.0–15.0)
Lymphocytes Relative: 40.7 % (ref 12.0–46.0)
Lymphs Abs: 1.6 10*3/uL (ref 0.7–4.0)
MCHC: 33.6 g/dL (ref 30.0–36.0)
MCV: 89.4 fl (ref 78.0–100.0)
Monocytes Absolute: 0.3 10*3/uL (ref 0.1–1.0)
Monocytes Relative: 8 % (ref 3.0–12.0)
Neutro Abs: 1.9 10*3/uL (ref 1.4–7.7)
Neutrophils Relative %: 47.2 % (ref 43.0–77.0)
Platelets: 230 10*3/uL (ref 150.0–400.0)
RBC: 4.69 Mil/uL (ref 3.87–5.11)
RDW: 12.6 % (ref 11.5–15.5)
WBC: 4 10*3/uL (ref 4.0–10.5)

## 2021-01-29 LAB — COMPREHENSIVE METABOLIC PANEL
ALT: 25 U/L (ref 0–35)
AST: 23 U/L (ref 0–37)
Albumin: 4.7 g/dL (ref 3.5–5.2)
Alkaline Phosphatase: 110 U/L (ref 39–117)
BUN: 11 mg/dL (ref 6–23)
CO2: 29 mEq/L (ref 19–32)
Calcium: 9.6 mg/dL (ref 8.4–10.5)
Chloride: 105 mEq/L (ref 96–112)
Creatinine, Ser: 0.72 mg/dL (ref 0.40–1.20)
GFR: 96.08 mL/min (ref >60.00)
Glucose, Bld: 104 mg/dL — ABNORMAL HIGH (ref 70–99)
Potassium: 5.2 mEq/L — ABNORMAL HIGH (ref 3.5–5.1)
Sodium: 141 mEq/L (ref 135–145)
Total Bilirubin: 0.6 mg/dL (ref 0.2–1.2)
Total Protein: 7.7 g/dL (ref 6.0–8.3)

## 2021-01-29 LAB — TSH: TSH: 1.68 u[IU]/mL (ref 0.35–5.50)

## 2021-01-29 LAB — FOLLICLE STIMULATING HORMONE: FSH: 78.3 m[IU]/mL

## 2021-01-29 MED ORDER — RIZATRIPTAN BENZOATE 10 MG PO TBDP: 10.0000 mg | ORAL_TABLET | ORAL | 3 refills | Status: DC | PRN

## 2021-01-29 NOTE — Patient Instructions (Signed)
Please return in 12 months for your annual complete physical; please come fasting. Schedule a nurse visit in 2-6 months to receive your 2nd of 2 Shingrix vaccination. You received the first today.   I will release your lab results to you on your MyChart account with further instructions. Please reply with any questions.    Please call and schedule your mammogram.    For hot flushes that affect sleep, you may use otc Valerian Root or black cohosh.   I recommend the Cologuard test for your colon cancer screening that is due. I have ordered this test for you. [facility] will soon contact you to verify your insurance, address etc. They will then send you the kit; follow the instructions in the kit and return the kit to [facility]. They will run the test and send the results to me. I will then give you the results. If this test is negative, we recommend repeating a colon cancer screening test in 3 years. If it is positive, I will refer you to a Gastroenterologist so you can get set up for the recommended colonoscopy.  Thank you!   If you have any questions or concerns, please don't hesitate to send me a message via MyChart or call the office at [phone]. Thank you for visiting with Ashley Livingston today! It's our pleasure caring for you.   Calcium Intake Recommendations You can take Caltrate Plus twice a day or get it through your diet or other OTC supplements (Viactiv, OsCal etc)  Calcium is a mineral that affects many functions in the body, including: Blood clotting. Blood vessel function. Nerve impulse conduction. Hormone secretion. Muscle contraction. Bone and teeth functions.  Most of your body's calcium supply is stored in your bones and teeth. When your calcium stores are low, you may be at risk for low bone mass, bone loss, and bone fractures. Consuming enough calcium helps to grow healthy bones and teeth and to prevent breakdown over time. It is very important that you get enough  calcium if you are: A child undergoing rapid growth. An adolescent girl. A pre- or post-menopausal woman. A woman whose menstrual cycle has stopped due to anorexia nervosa or regular intense exercise. An individual with lactose intolerance or a milk allergy. A vegetarian.  What is my plan? Try to consume the recommended amount of calcium daily based on your age. Depending on your overall health, your health care provider may recommend increased calcium intake. General daily calcium intake recommendations by age are: Birth to 6 months: 200 mg. Infants 7 to 12 months: 260 mg. Children 1 to 3 years: 700 mg. Children 4 to 8 years: 1,000 mg. Children 9 to 13 years: 1,300 mg. Teens 14 to 18 years: 1,300 mg. Adults 19 to 50 years: 1,000 mg. Adult women 51 to 70 years: 1,200 mg. Adult men 51 to 70 years: 1,000 mg. Adults 71 years and older: 1,200 mg. Pregnant and breastfeeding teens: 1,300 mg. Pregnant and breastfeeding adults: 1,000 mg.  What do I need to know about calcium intake? In order for the body to absorb calcium, it needs vitamin D. You can get vitamin D through (we recommend getting [phone] units of Vitamin D daily) Direct exposure of the skin to sunlight. Foods, such as egg yolks, liver, saltwater fish, and fortified milk. Supplements. Consuming too much calcium may cause: Constipation. Decreased absorption of iron and zinc. Kidney stones. Calcium supplements may interact with certain medicines. Check with your health care provider before starting any calcium  supplements. Try to get most of your calcium from food. What foods can I eat? Grains  Fortified oatmeal. Fortified ready-to-eat cereals. Fortified frozen waffles. Vegetables Turnip greens. Broccoli. Fruits Fortified orange juice. Meats and Other Protein Sources Canned sardines with bones. Canned salmon with bones. Soy beans. Tofu. Baked beans. Almonds. [place] nuts. Sunflower seeds. Dairy Milk. Yogurt. Cheese.  Cottage cheese. Beverages Fortified soy milk. Fortified rice milk. Sweets/Desserts Pudding. Ice Cream. Milkshakes. Blackstrap molasses. The items listed above may not be a complete list of recommended foods or beverages. Contact your dietitian for more options. What foods can affect my calcium intake? It may be more difficult for your body to use calcium or calcium may leave your body more quickly if you consume large amounts of: Sodium. Protein. Caffeine. Alcohol.  This information is not intended to replace advice given to you by your health care provider. Make sure you discuss any questions you have with your health care provider. Document Released: 10/17/2003 Document Revised: 09/22/2015 Document Reviewed: 08/10/2013 Elsevier Interactive Patient Education  2018 [employer].

## 2021-01-29 NOTE — Progress Notes (Signed)
Subjective  Chief Complaint  Patient presents with   Annual Exam    Fasting   Menopause    Discuss hormone replacement if needed    HPI: Ashley Livingston is a 50 y.o. female who presents to [facility] [place] today for a Female Wellness Visit. She also has the concerns and/or needs as listed above in the chief complaint. These will be addressed in addition to the Health Maintenance Visit.   Wellness Visit: annual visit with health maintenance review and exam without Pap  Health maintenance: Due for colorectal cancer screening.  Average risk patient.  Defers colonoscopy.  Eligible for Shingrix and flu vaccination. Due for mammogram patient to schedule.  Healthy lifestyle.  Overall feeling great. Chronic disease f/u and/or acute problem visit: (deemed necessary to be done in addition to the wellness visit): Migraine headaches: Fortunately much improved since.  Menopausal/postmenopausal.  Rare headache now.  Needs refill on Maxalt which works well.  Last migraine was over 2 months ago Perimenopause: Went almost a full year without any bleeding.  Had some mild spotting a few weeks ago.  Had significant hot flashes that have now improved.  They had affected her sleep.  No mood problems or irritability. No recent kidney stones. On treatment for glaucoma.  Strong family history, father.  Assessment  1. Annual physical exam   2. Migraine without aura and without status migrainosus, not intractable   3. Nephrolithiasis   4. Postmenopausal   5. Acute angle-closure glaucoma of both eyes   6. Screening for colorectal cancer      Plan  Female Wellness Visit: Age appropriate Health Maintenance and Prevention measures were discussed with patient. Included topics are cancer screening recommendations, ways to keep healthy (see AVS) including dietary and exercise recommendations, regular eye and dental care, use of seat belts, and avoidance of moderate alcohol use and tobacco  use.  Patient to schedule mammogram.  Discussed colorectal cancer screens.  Patient elects Cologuard. BMI: discussed patient's BMI and encouraged positive lifestyle modifications to help get to or maintain a target BMI. HM needs and immunizations were addressed and ordered. See below for orders. See HM and immunization section for updates.  Shingrix 1 today.  Defers flu shot Routine labs and screening tests ordered including cmp, cbc and lipids where appropriate. Discussed recommendations regarding Vit D and calcium supplementation (see AVS)  Chronic disease management visit and/or acute problem visit: Likely postmenopausal: Check FSH.  If recurrent spotting occurs, can consider evaluation.  Discussed sleep: Could use valerian root for blood cohosh if needed for hot flashes in the future.  No need for HRT at this time. Migraines: Intermittent Maxalt.  Well-controlled  Follow up: 12 months for complete physical Orders Placed This Encounter  Procedures   Varicella-zoster vaccine IM (Shingrix)   CBC with Differential/Platelet   Comprehensive metabolic panel   Lipid panel   Cologuard   TSH   Follicle stimulating hormone   Meds ordered this encounter  Medications   rizatriptan (MAXALT-MLT) 10 MG disintegrating tablet    Sig: Take 1 tablet (10 mg total) by mouth as needed for migraine. May repeat in 2 hours if needed    Dispense:  20 tablet    Refill:  3      Body mass index is 21.68 kg/m. Wt Readings from Last 3 Encounters:  01/29/21 142 lb 9.6 oz (64.7 kg)  09/11/20 133 lb 8 oz (60.6 kg)  01/27/20 149 lb 6.4 oz (67.8 kg)  Patient Active Problem List   Diagnosis Date Noted   Glaucoma 12/07/2019   Adhesive capsulitis of left shoulder 12/08/2017   Nephrolithiasis 10/21/2017   Migraine without aura and without status migrainosus, not intractable 05/27/2016   Health Maintenance  Topic Date Due   Fecal DNA (Cologuard)  Never done   MAMMOGRAM  12/09/2020   COVID-19 Vaccine  (4 - Booster for Pfizer series) 04/17/2021 (Originally 02/28/2020)   Zoster Vaccines- Shingrix (1 of 2) 04/17/2021 (Originally 07/30/2018)   INFLUENZA VACCINE  06/15/2021 (Originally 10/16/2020)   PAP SMEAR-Modifier  10/22/2022   TETANUS/TDAP  01/26/2030   Hepatitis C Screening  Completed   HIV Screening  Completed   Pneumococcal Vaccine 2-90 Years old  Aged Out   HPV VACCINES  Aged Out   Immunization History  Administered Date(s) Administered   PFIZER(Purple Top)SARS-COV-2 Vaccination 05/27/2019, 06/21/2019, 01/03/2020   Tdap 01/27/2020   We updated and reviewed the patient's past history in detail and it is documented below. Allergies: Patient is allergic to asa [aspirin] and other. Past Medical History Patient  has a past medical history of Glaucoma, Hepatitis A, History of kidney stones, Migraines, and Pneumonia. Past Surgical History Patient  has a past surgical history that includes Shoulder arthroscopy (Right); Repair peroneal tendons ankle (Right); Lithotripsy (09/25/2017); Cervical polypectomy; and Extracorporeal shock wave lithotripsy (Left, 09/25/2017). Family History: Patient family history includes Basal cell carcinoma in her father; Healthy in her mother; Hyperlipidemia in her brother and father; Migraines in her brother, father, and sister; Pulmonary embolism in her brother. Social History:  Patient  reports that she has never smoked. She has never used smokeless tobacco. She reports current alcohol use. She reports that she does not use drugs.  Review of Systems: Constitutional: negative for fever or malaise Ophthalmic: negative for photophobia, double vision or loss of vision Cardiovascular: negative for chest pain, dyspnea on exertion, or new LE swelling Respiratory: negative for SOB or persistent cough Gastrointestinal: negative for abdominal pain, change in bowel habits or melena Genitourinary: negative for dysuria or gross hematuria, no abnormal uterine bleeding or  disharge Musculoskeletal: negative for new gait disturbance or muscular weakness Integumentary: negative for new or persistent rashes, no breast lumps Neurological: negative for TIA or stroke symptoms Psychiatric: negative for SI or delusions Allergic/Immunologic: negative for hives  Patient Care Team    Relationship Specialty Notifications Start End  Leamon Arnt, MD PCP - General Family Medicine  08/19/17     Objective  Vitals: BP 100/78   Pulse (!) 54   Temp 98.2 F (36.8 C) (Temporal)   Ht 5\' 8"  (1.727 m)   Wt 142 lb 9.6 oz (64.7 kg)   SpO2 99%   BMI 21.68 kg/m  General:  Well developed, well nourished, no acute distress  Psych:  Alert and orientedx3,normal mood and affect HEENT:  Normocephalic, atraumatic, non-icteric sclera,  supple neck without adenopathy, mass or thyromegaly Cardiovascular:  Normal S1, S2, RRR without gallop, rub or murmur Respiratory:  Good breath sounds bilaterally, CTAB with normal respiratory effort Gastrointestinal: normal bowel sounds, soft, non-tender, no noted masses. No HSM MSK: no deformities, contusions. Joints are without erythema or swelling.  Skin:  Warm, no rashes or suspicious lesions noted Neurologic:    Mental status is normal. CN 2-11 are normal. Gross motor and sensory exams are normal. Normal gait. No tremor   Commons side effects, risks, benefits, and alternatives for medications and treatment plan prescribed today were discussed, and the patient expressed understanding of the given  instructions. Patient is instructed to call or message via MyChart if he/she has any questions or concerns regarding our treatment plan. No barriers to understanding were identified. We discussed Red Flag symptoms and signs in detail. Patient expressed understanding regarding what to do in case of urgent or emergency type symptoms.  Medication list was reconciled, printed and provided to the patient in AVS. Patient instructions and summary information was  reviewed with the patient as documented in the AVS. This note was prepared with assistance of Dragon voice recognition software. Occasional wrong-word or sound-a-like substitutions may have occurred due to the inherent limitations of voice recognition software  This visit occurred during the SARS-CoV-2 public health emergency.  Safety protocols were in place, including screening questions prior to the visit, additional usage of staff PPE, and extensive cleaning of exam room while observing appropriate contact time as indicated for disinfecting solutions.

## 2021-02-06 ENCOUNTER — Other Ambulatory Visit: Payer: Self-pay | Admitting: Family Medicine

## 2021-02-06 DIAGNOSIS — Z1231 Encounter for screening mammogram for malignant neoplasm of breast: Secondary | ICD-10-CM

## 2021-02-21 ENCOUNTER — Telehealth: Payer: Self-pay

## 2021-02-21 NOTE — Telephone Encounter (Signed)
Spoke with patient, resent order will check back in about 48hrs.

## 2021-02-21 NOTE — Telephone Encounter (Signed)
Pt called stating that no one has called her to schedule for a Cologuard test. Pt would like a call back to discuss. Please Advise.

## 2021-02-23 NOTE — Telephone Encounter (Signed)
Patient is calling in stating that she hasn't received the cologuard test and wanted to know what to do.

## 2021-02-23 NOTE — Telephone Encounter (Signed)
Spoke with patient, let her know that [facility] asked me to give them 72 hours from when I sent it. Will check again on monday

## 2021-02-26 ENCOUNTER — Other Ambulatory Visit: Payer: Self-pay

## 2021-02-26 DIAGNOSIS — Z1211 Encounter for screening for malignant neoplasm of colon: Secondary | ICD-10-CM

## 2021-02-26 NOTE — Telephone Encounter (Signed)
Spoke with [facility], resent the order electronically while on the phone. They confirmed that they received the order this time, informed patient she should be receiving her kit in 3-7 days.

## 2021-03-13 ENCOUNTER — Ambulatory Visit
Admission: RE | Admit: 2021-03-13 | Discharge: 2021-03-13 | Disposition: A | Discharge status: Home / Self Care | Payer: No Typology Code available for payment source | Source: Ambulatory Visit | Attending: Family Medicine | Admitting: Family Medicine

## 2021-03-13 DIAGNOSIS — Z1231 Encounter for screening mammogram for malignant neoplasm of breast: Secondary | ICD-10-CM

## 2021-03-16 LAB — COLOGUARD: COLOGUARD: NEGATIVE

## 2021-04-25 ENCOUNTER — Other Ambulatory Visit: Payer: Self-pay

## 2021-04-25 ENCOUNTER — Ambulatory Visit (INDEPENDENT_AMBULATORY_CARE_PROVIDER_SITE_OTHER): Payer: No Typology Code available for payment source | Admitting: *Deleted

## 2021-04-25 DIAGNOSIS — Z23 Encounter for immunization: Secondary | ICD-10-CM | POA: Diagnosis not present

## 2021-04-25 NOTE — Progress Notes (Signed)
Pt here with for  Shingrix vaccine #2, allergies reviewed. Vaccines given, tolerated well.

## 2021-12-10 ENCOUNTER — Encounter: Payer: Self-pay | Admitting: *Deleted

## 2022-01-30 ENCOUNTER — Encounter: Payer: Self-pay | Admitting: Family Medicine

## 2022-01-30 ENCOUNTER — Ambulatory Visit (INDEPENDENT_AMBULATORY_CARE_PROVIDER_SITE_OTHER): Payer: No Typology Code available for payment source | Admitting: Family Medicine

## 2022-01-30 VITALS — BP 100/70 | HR 57 | Temp 98.0°F | Ht 68.0 in | Wt 153.8 lb

## 2022-01-30 DIAGNOSIS — N950 Postmenopausal bleeding: Secondary | ICD-10-CM | POA: Diagnosis not present

## 2022-01-30 DIAGNOSIS — Z1231 Encounter for screening mammogram for malignant neoplasm of breast: Secondary | ICD-10-CM

## 2022-01-30 DIAGNOSIS — M7712 Lateral epicondylitis, left elbow: Secondary | ICD-10-CM | POA: Diagnosis not present

## 2022-01-30 DIAGNOSIS — G43009 Migraine without aura, not intractable, without status migrainosus: Secondary | ICD-10-CM

## 2022-01-30 DIAGNOSIS — H409 Unspecified glaucoma: Secondary | ICD-10-CM | POA: Diagnosis not present

## 2022-01-30 DIAGNOSIS — Z0000 Encounter for general adult medical examination without abnormal findings: Secondary | ICD-10-CM | POA: Diagnosis not present

## 2022-01-30 LAB — COMPREHENSIVE METABOLIC PANEL
ALT: 23 U/L (ref 0–35)
AST: 24 U/L (ref 0–37)
Albumin: 4.6 g/dL (ref 3.5–5.2)
Alkaline Phosphatase: 102 U/L (ref 39–117)
BUN: 10 mg/dL (ref 6–23)
CO2: 29 mEq/L (ref 19–32)
Calcium: 9.6 mg/dL (ref 8.4–10.5)
Chloride: 104 mEq/L (ref 96–112)
Creatinine, Ser: 0.61 mg/dL (ref 0.40–1.20)
GFR: 102.01 mL/min (ref >60.00)
Glucose, Bld: 103 mg/dL — ABNORMAL HIGH (ref 70–99)
Potassium: 4.7 mEq/L (ref 3.5–5.1)
Sodium: 139 mEq/L (ref 135–145)
Total Bilirubin: 0.5 mg/dL (ref 0.2–1.2)
Total Protein: 7.8 g/dL (ref 6.0–8.3)

## 2022-01-30 LAB — CBC WITH DIFFERENTIAL/PLATELET
Basophils Absolute: 0.1 10*3/uL (ref 0.0–0.1)
Basophils Relative: 1.2 % (ref 0.0–3.0)
Eosinophils Absolute: 0.1 10*3/uL (ref 0.0–0.7)
Eosinophils Relative: 1.6 % (ref 0.0–5.0)
HCT: 41.9 % (ref 36.0–46.0)
Hemoglobin: 14.2 g/dL (ref 12.0–15.0)
Lymphocytes Relative: 31.9 % (ref 12.0–46.0)
Lymphs Abs: 1.5 10*3/uL (ref 0.7–4.0)
MCHC: 34 g/dL (ref 30.0–36.0)
MCV: 88.3 fl (ref 78.0–100.0)
Monocytes Absolute: 0.3 10*3/uL (ref 0.1–1.0)
Monocytes Relative: 6.7 % (ref 3.0–12.0)
Neutro Abs: 2.8 10*3/uL (ref 1.4–7.7)
Neutrophils Relative %: 58.6 % (ref 43.0–77.0)
Platelets: 250 10*3/uL (ref 150.0–400.0)
RBC: 4.74 Mil/uL (ref 3.87–5.11)
RDW: 13.1 % (ref 11.5–15.5)
WBC: 4.8 10*3/uL (ref 4.0–10.5)

## 2022-01-30 LAB — TSH: TSH: 1.68 u[IU]/mL (ref 0.35–5.50)

## 2022-01-30 LAB — LIPID PANEL
Cholesterol: 263 mg/dL — ABNORMAL HIGH (ref 0–200)
HDL: 82.3 mg/dL (ref >39.00)
LDL Cholesterol: 170 mg/dL — ABNORMAL HIGH (ref 0–99)
NonHDL: 181.06
Total CHOL/HDL Ratio: 3
Triglycerides: 56 mg/dL (ref 0.0–149.0)
VLDL: 11.2 mg/dL (ref 0.0–40.0)

## 2022-01-30 NOTE — Progress Notes (Signed)
Subjective  Chief Complaint  Patient presents with   Annual Exam    Pt here for annual exam and is currently fasting     HPI: Ashley Livingston is a 51 y.o. female who presents to [facility] at [place] today for a Female Wellness Visit. She also has the concerns and/or needs as listed above in the chief complaint. These will be addressed in addition to the Health Maintenance Visit.   Wellness Visit: annual visit with health maintenance review and exam without Pap  HM: due mammo in December; pap due next year. CRC screen current with cologuard. Exercises daily. Cooks most meals. Feels well. Declines flu vaccine. Eye exam is current (glaucoma) Chronic disease f/u and/or acute problem visit: (deemed necessary to be done in addition to the wellness visit): Migraines: had been rare until last month when she had two weekend in a row with headaches; unclear triggers. Eventually resolved. No red flag sxs Reports had light vaginal bleeding x 2 in the summer/ no pain, no discharge. Had some menstrual like sxs. Last TSH 78 last year.  Left tennis elbow x 2 months; PT at ortho and nsaids and rest but still painful. Worries about steroid injections due to glaucoma history.   Assessment  1. Annual physical exam   2. Migraine without aura and without status migrainosus, not intractable   3. Encounter for screening mammogram for breast cancer   4. Postmenopausal bleeding   5. Glaucoma of both eyes, unspecified glaucoma type   6. Left tennis elbow      Plan  Female Wellness Visit: Age appropriate Health Maintenance and Prevention measures were discussed with patient. Included topics are cancer screening recommendations, ways to keep healthy (see AVS) including dietary and exercise recommendations, regular eye and dental care, use of seat belts, and avoidance of moderate alcohol use and tobacco use. Mammo in December. Other screens are current BMI: discussed patient's BMI and  encouraged positive lifestyle modifications to help get to or maintain a target BMI. HM needs and immunizations were addressed and ordered. See below for orders. See HM and immunization section for updates. Routine labs and screening tests ordered including cmp, cbc and lipids where appropriate. Discussed recommendations regarding Vit D and calcium supplementation (see AVS)  Chronic disease management visit and/or acute problem visit: Migraines: maxalt for abortive meds; monitor for frequency; would need to consider preventives if worsen again.  Glaucoma per ophtho Tennis elbow: agree with rest but suspect will need steroid injection. Education given Postmenopausal bleeding: needs evaluation: start with TVUS.   Follow up: 12 mo for cpe  Orders Placed This Encounter  Procedures   MM DIGITAL SCREENING BILATERAL   US PELVIC COMPLETE WITH TRANSVAGINAL   CBC with Differential/Platelet   Comprehensive metabolic panel   Lipid panel   TSH   No orders of the defined types were placed in this encounter.     Body mass index is 23.39 kg/m. Wt Readings from Last 3 Encounters:  01/30/22 153 lb 12.8 oz (69.8 kg)  01/29/21 142 lb 9.6 oz (64.7 kg)  09/11/20 133 lb 8 oz (60.6 kg)     Patient Active Problem List   Diagnosis Date Noted   Glaucoma 12/07/2019   Adhesive capsulitis of left shoulder 12/08/2017   Nephrolithiasis 10/21/2017   Migraine without aura and without status migrainosus, not intractable 05/27/2016   Health Maintenance  Topic Date Due   COVID-19 Vaccine (4 - Pfizer series) 02/15/2022 (Originally 02/28/2020)   INFLUENZA VACCINE  06/16/2022 (Originally 10/16/2021)   MAMMOGRAM  03/13/2022   PAP SMEAR-Modifier  10/22/2022   Fecal DNA (Cologuard)  03/06/2024   TETANUS/TDAP  01/26/2030   Hepatitis C Screening  Completed   HIV Screening  Completed   Zoster Vaccines- Shingrix  Completed   HPV VACCINES  Aged Out   Immunization History  Administered Date(s) Administered    PFIZER(Purple Top)SARS-COV-2 Vaccination 05/27/2019, 06/21/2019, 01/03/2020   Tdap 01/27/2020   Zoster Recombinat (Shingrix) 01/29/2021, 04/25/2021   We updated and reviewed the patient's past history in detail and it is documented below. Allergies: Patient is allergic to asa [aspirin] and other. Past Medical History Patient  has a past medical history of Glaucoma, Hepatitis A, History of kidney stones, Migraines, and Pneumonia. Past Surgical History Patient  has a past surgical history that includes Shoulder arthroscopy (Right); Repair peroneal tendons ankle (Right); Lithotripsy (09/25/2017); Cervical polypectomy; and Extracorporeal shock wave lithotripsy (Left, 09/25/2017). Family History: Patient family history includes Basal cell carcinoma in her father; Healthy in her mother; Hyperlipidemia in her brother and father; Migraines in her brother, father, and sister; Pulmonary embolism in her brother. Social History:  Patient  reports that she has never smoked. She has never used smokeless tobacco. She reports current alcohol use. She reports that she does not use drugs.  Review of Systems: Constitutional: negative for fever or malaise Ophthalmic: negative for photophobia, double vision or loss of vision Cardiovascular: negative for chest pain, dyspnea on exertion, or new LE swelling Respiratory: negative for SOB or persistent cough Gastrointestinal: negative for abdominal pain, change in bowel habits or melena Genitourinary: negative for dysuria or gross hematuria, no abnormal uterine bleeding or disharge Musculoskeletal: negative for new gait disturbance or muscular weakness Integumentary: negative for new or persistent rashes, no breast lumps Neurological: negative for TIA or stroke symptoms Psychiatric: negative for SI or delusions Allergic/Immunologic: negative for hives  Patient Care Team    Relationship Specialty Notifications Start End  Willow Ora, MD PCP - General Family  Medicine  08/19/17     Objective  Vitals: BP 100/70   Pulse (!) 57   Temp 98 F (36.7 C)   Ht 5\' 8"  (1.727 m)   Wt 153 lb 12.8 oz (69.8 kg)   SpO2 98%   BMI 23.39 kg/m  General:  Well developed, well nourished, no acute distress  Psych:  Alert and orientedx3,normal mood and affect HEENT:  Normocephalic, atraumatic, non-icteric sclera,  supple neck without adenopathy, mass or thyromegaly Cardiovascular:  Normal S1, S2, RRR without gallop, rub or murmur Respiratory:  Good breath sounds bilaterally, CTAB with normal respiratory effort Gastrointestinal: normal bowel sounds, soft, non-tender, no noted masses. No HSM MSK: no deformities, contusions. Joints are without erythema or swelling.  Skin:  Warm, no rashes or suspicious lesions noted Neurologic:    Mental status is normal. CN 2-11 are normal. Gross motor and sensory exams are normal. Normal gait. No tremor Left elbow: lateral epicondyle ttp, FROM  Commons side effects, risks, benefits, and alternatives for medications and treatment plan prescribed today were discussed, and the patient expressed understanding of the given instructions. Patient is instructed to call or message via MyChart if he/she has any questions or concerns regarding our treatment plan. No barriers to understanding were identified. We discussed Red Flag symptoms and signs in detail. Patient expressed understanding regarding what to do in case of urgent or emergency type symptoms.  Medication list was reconciled, printed and provided to the patient in AVS. Patient instructions and  summary information was reviewed with the patient as documented in the AVS. This note was prepared with assistance of Dragon voice recognition software. Occasional wrong-word or sound-a-like substitutions may have occurred due to the inherent limitations of voice recognition software

## 2022-01-30 NOTE — Patient Instructions (Addendum)
Please return in 12 months for your annual complete physical; please come fasting.   I will release your lab results to you on your MyChart account with further instructions. You may see the results before I do, but when I review them I will send you a message with my report or have my assistant call you if things need to be discussed. Please reply to my message with any questions. Thank you!   We will call you to get you scheduled for an ultrasound of your uterus.   If you have any questions or concerns, please don't hesitate to send me a message via MyChart or call the office at [phone]. Thank you for visiting with [place] today! It's our pleasure caring for you.   I have ordered a mammogram and/or bone density for you as we discussed today: It is due after December 27th  [x]   Mammogram  []   Bone Density  Please call the office checked below to schedule your appointment:  [x]   [facility]      [address] [place],        [address] East Wing         []   [facility]  [address] [place],  [facility]  Postmenopausal Bleeding Postmenopausal bleeding is any bleeding that a woman has after she has entered menopause. Menopause is the end of a woman's fertile years. After menopause, a woman no longer ovulates and does not have menstrual periods. Therefore, she should no longer have bleeding from her vagina. Postmenopausal bleeding may have various causes, including: Menopausal hormone therapy (MHT). Endometrial atrophy. After menopause, low estrogen hormone levels cause the membrane that lines the uterus (endometrium) to become thin. You may have bleeding as the endometrium thins. Endometrial hyperplasia. This condition is caused by excess estrogen hormones and low levels of progesterone hormones. The excess estrogen causes the endometrium to thicken, which can lead to bleeding. In some cases, this can lead to cancer of the  uterus. Endometrial cancer. Noncancerous growths (polyps) on the endometrium, the lining of the uterus, or the cervix. Uterine fibroids. These are noncancerous growths in or around the uterus muscle tissue that can cause heavy bleeding. Any type of postmenopausal bleeding, even if it appears to be a typical menstrual period, should be checked by your health care provider. Treatment will depend on the cause of the bleeding. Follow these instructions at home:  Pay attention to any changes in your symptoms. Let your health care provider know about them. Avoid using tampons and douches as told by your health care provider. Change your pads regularly. Get regular pelvic exams, including Pap tests, as told by your health care provider. Take iron supplements as told by your health care provider. Take over-the-counter and prescription medicines only as told by your health care provider. Keep all follow-up visits. This is important. Contact a health care provider if: You have new bleeding from the vagina after menopause. You have pain in your abdomen. Get help right away if: You have a fever or chills. You have severe pain with bleeding. You are passing blood clots. You have heavy bleeding, need more than 1 pad an hour, and have never experienced this before. You have headaches or feel faint or dizzy. Summary Postmenopausal bleeding is any bleeding that a woman has after she has entered into menopause. Postmenopausal bleeding may have various causes. Treatment will depend on the cause of  the bleeding. Any type of postmenopausal bleeding, even if it appears to be a typical menstrual period, should be checked by your health care provider. Be sure to pay attention to any changes in your symptoms and keep all follow-up visits. This information is not intended to replace advice given to you by your health care provider. Make sure you discuss any questions you have with your health care  provider. Document Revised: 08/19/2019 Document Reviewed: 08/19/2019 Elsevier Patient Education  2023 [employer].

## 2022-02-01 IMAGING — MG MM DIGITAL SCREENING BILAT W/ TOMO AND CAD
8 series · 8 of 24 positions shown · non-contrast
Comparison: Previous exam(s).

CLINICAL DATA: Screening.

EXAM:
DIGITAL SCREENING BILATERAL MAMMOGRAM WITH TOMOSYNTHESIS AND CAD
TECHNIQUE: Bilateral screening digital craniocaudal and mediolateral oblique
mammograms were obtained. Bilateral screening digital breast
tomosynthesis was performed. The images were evaluated with
computer-aided detection.

[L CC synth-2D]
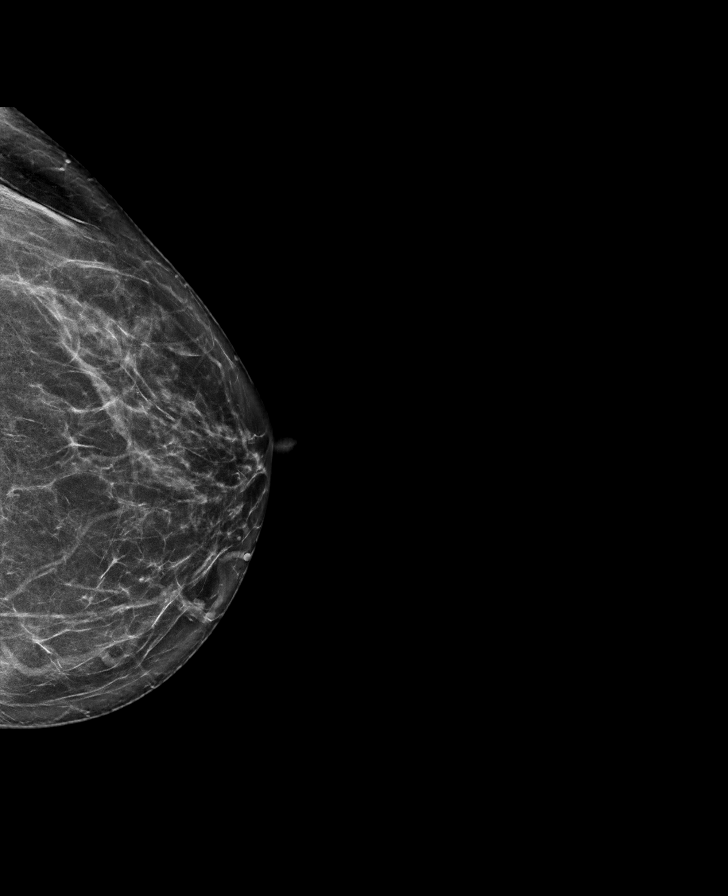

[R CC synth-2D]
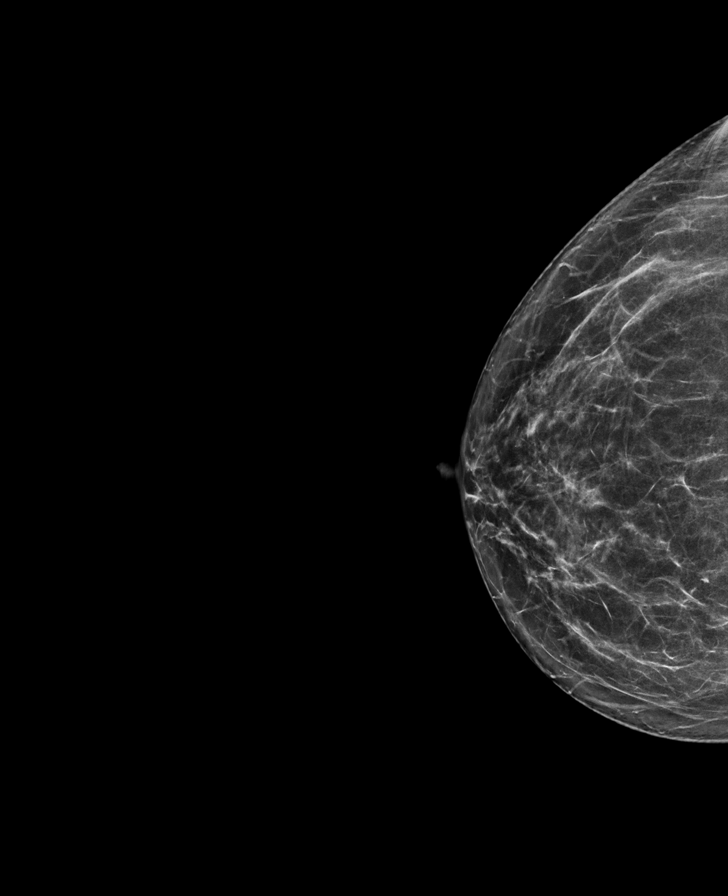

[L MLO synth-2D]
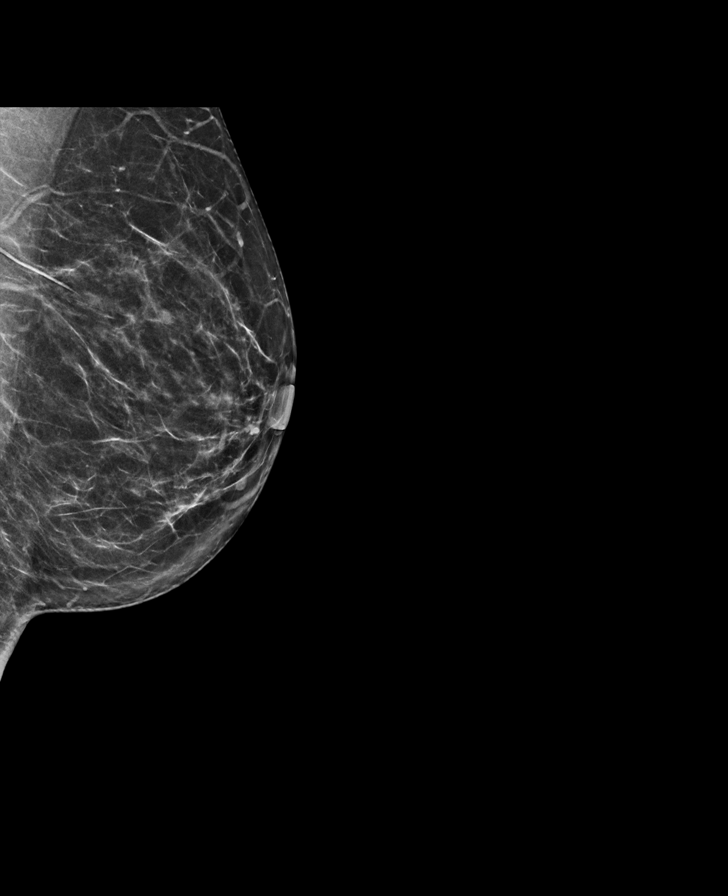

[R MLO synth-2D]
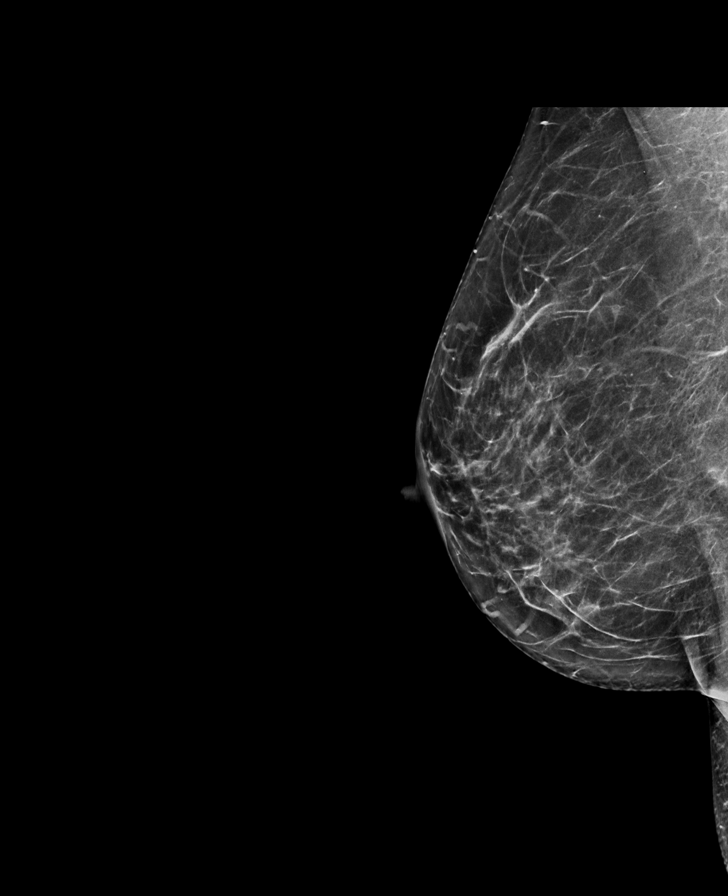

[L MLO tomo · tomo slice 33/66.0]
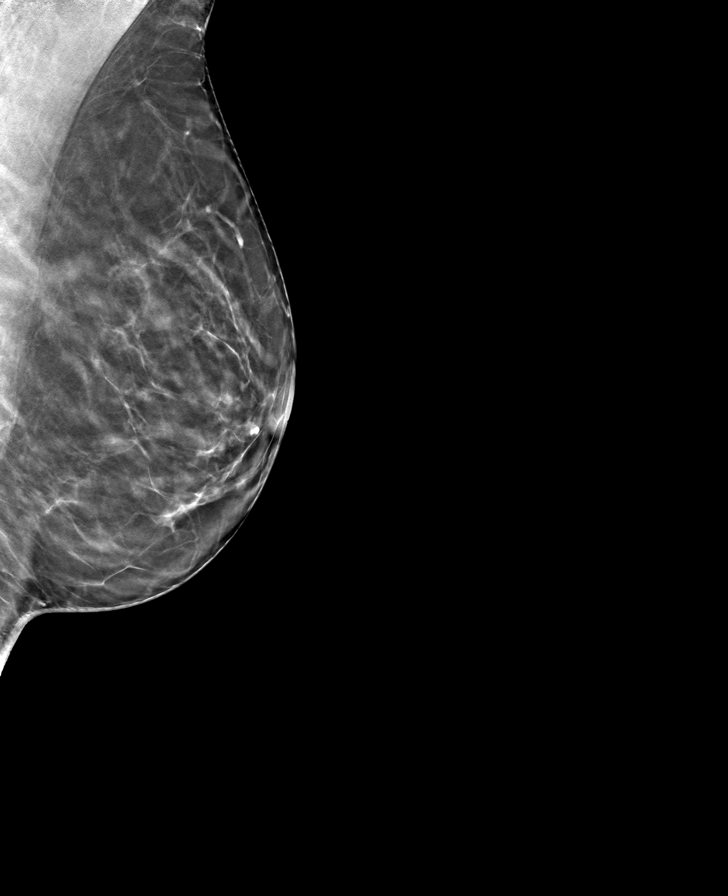

[R CC tomo · tomo slice 35/68.0]
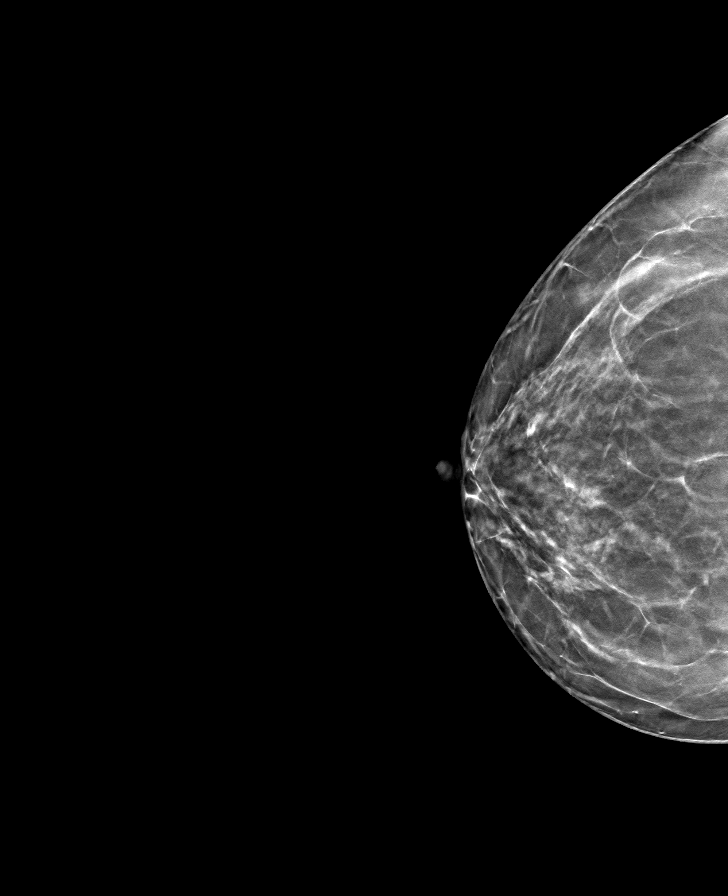

[L CC tomo · tomo slice 28/55.0]
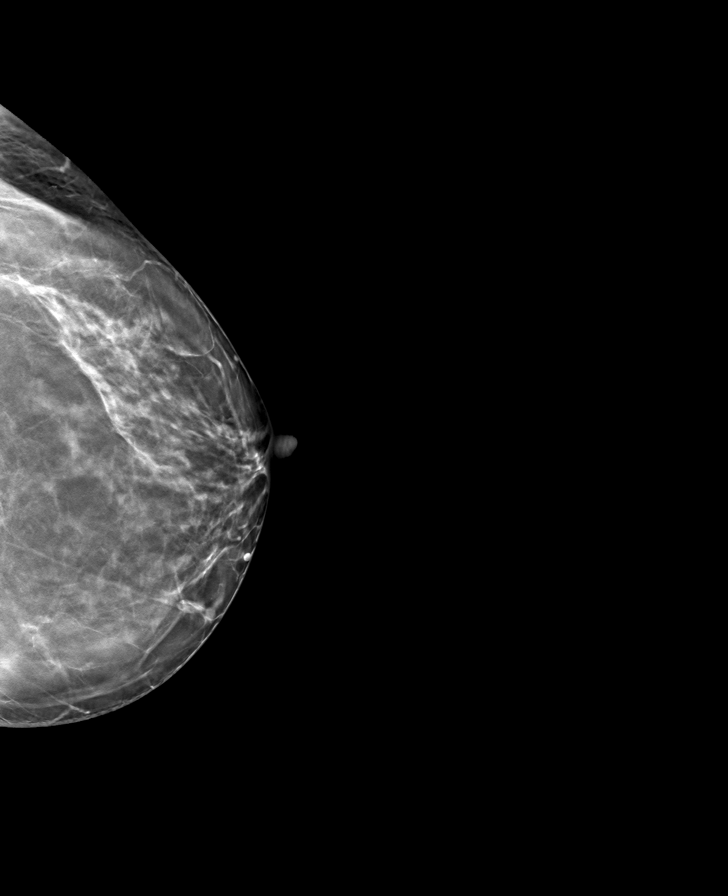

[R MLO tomo · tomo slice 36/71.0]
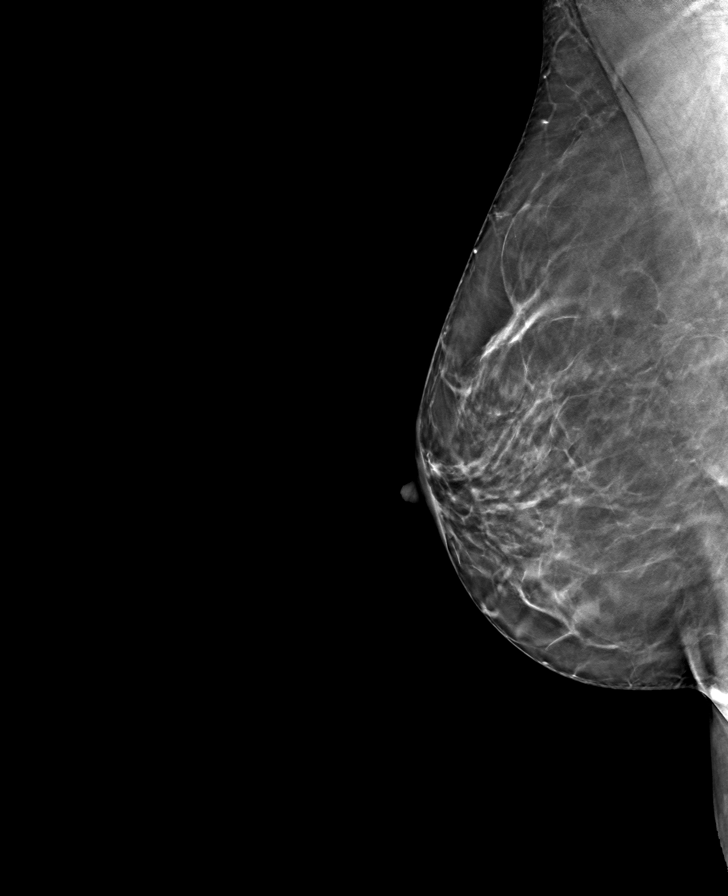

[8 of 24 positions shown; findings below may reference images not displayed]

ACR Breast Density Category b: There are scattered areas of
fibroglandular density.
FINDINGS: There are no findings suspicious for malignancy.
IMPRESSION: No mammographic evidence of malignancy. A result letter of this
screening mammogram will be mailed directly to the patient.

RECOMMENDATION:
Screening mammogram in one year. (Code:51-O-LD2)

BI-RADS CATEGORY  1: Negative.

## 2022-02-12 ENCOUNTER — Ambulatory Visit
Admission: RE | Admit: 2022-02-12 | Discharge: 2022-02-12 | Disposition: A | Discharge status: Home / Self Care | Payer: No Typology Code available for payment source | Source: Ambulatory Visit | Attending: Family Medicine | Admitting: Family Medicine

## 2022-02-12 DIAGNOSIS — N950 Postmenopausal bleeding: Secondary | ICD-10-CM

## 2022-02-13 ENCOUNTER — Encounter: Payer: Self-pay | Admitting: Family Medicine

## 2022-02-13 DIAGNOSIS — N950 Postmenopausal bleeding: Secondary | ICD-10-CM | POA: Insufficient documentation

## 2022-02-25 ENCOUNTER — Telehealth: Payer: Self-pay | Admitting: Family Medicine

## 2022-02-25 NOTE — Telephone Encounter (Signed)
   LAST APPOINTMENT DATE:   01/30/22 CPE with PCP   NEXT APPOINTMENT DATE: 02/05/23 CPE with PCP   MEDICATION: rizatriptan (MAXALT-MLT) 10 MG disintegrating tablet [[id number]]    Is the patient out of medication?  Not yet   PHARMACY: [id number] [place], [place] - [address] AT [facility] [address] [facility] [address] & [facility] [address], [place] [address] Phone: [phone]  Fax: [phone] DEA #: [id number]

## 2022-02-26 ENCOUNTER — Other Ambulatory Visit: Payer: Self-pay

## 2022-02-26 MED ORDER — RIZATRIPTAN BENZOATE 10 MG PO TBDP: 10.0000 mg | ORAL_TABLET | ORAL | 3 refills | Status: DC | PRN

## 2022-02-26 NOTE — Telephone Encounter (Signed)
Rx sent 

## 2022-03-22 ENCOUNTER — Ambulatory Visit
Admission: RE | Admit: 2022-03-22 | Discharge: 2022-03-22 | Disposition: A | Discharge status: Home / Self Care | Payer: No Typology Code available for payment source | Source: Ambulatory Visit | Attending: Family Medicine | Admitting: Family Medicine

## 2022-03-22 DIAGNOSIS — Z0000 Encounter for general adult medical examination without abnormal findings: Secondary | ICD-10-CM

## 2022-03-22 DIAGNOSIS — Z1231 Encounter for screening mammogram for malignant neoplasm of breast: Secondary | ICD-10-CM

## 2022-06-28 DIAGNOSIS — R1084 Generalized abdominal pain: Secondary | ICD-10-CM | POA: Insufficient documentation

## 2022-06-28 DIAGNOSIS — R1033 Periumbilical pain: Secondary | ICD-10-CM | POA: Diagnosis present

## 2022-06-28 NOTE — ED Triage Notes (Signed)
To ED via EMS with c/o diffuse abd pain and N/V/D with onset around 2100. Had LR and 4mg  Zofran enroute. FSBS 139.

## 2022-06-29 ENCOUNTER — Other Ambulatory Visit: Payer: Self-pay

## 2022-06-29 ENCOUNTER — Emergency Department (HOSPITAL_BASED_OUTPATIENT_CLINIC_OR_DEPARTMENT_OTHER): Payer: No Typology Code available for payment source

## 2022-06-29 ENCOUNTER — Emergency Department (HOSPITAL_BASED_OUTPATIENT_CLINIC_OR_DEPARTMENT_OTHER)
Admission: EM | Admit: 2022-06-29 | Discharge: 2022-06-29 | Disposition: A | Discharge status: Home / Self Care | Payer: No Typology Code available for payment source | Attending: Emergency Medicine | Admitting: Emergency Medicine

## 2022-06-29 DIAGNOSIS — R1084 Generalized abdominal pain: Secondary | ICD-10-CM

## 2022-06-29 LAB — URINALYSIS, ROUTINE W REFLEX MICROSCOPIC
Bacteria, UA: NONE SEEN
Bilirubin Urine: NEGATIVE
Glucose, UA: NEGATIVE mg/dL
Hgb urine dipstick: NEGATIVE
Ketones, ur: NEGATIVE mg/dL
Leukocytes,Ua: NEGATIVE
Nitrite: NEGATIVE
Protein, ur: NEGATIVE mg/dL
Specific Gravity, Urine: >1.046 — ABNORMAL HIGH (ref 1.005–1.030)
pH: 5 (ref 5.0–8.0)

## 2022-06-29 LAB — COMPREHENSIVE METABOLIC PANEL
ALT: 30 U/L (ref 0–44)
AST: 51 U/L — ABNORMAL HIGH (ref 15–41)
Albumin: 4.5 g/dL (ref 3.5–5.0)
Alkaline Phosphatase: 79 U/L (ref 38–126)
Anion gap: 12 (ref 5–15)
BUN: 16 mg/dL (ref 6–20)
CO2: 21 mmol/L — ABNORMAL LOW (ref 22–32)
Calcium: 9.6 mg/dL (ref 8.9–10.3)
Chloride: 108 mmol/L (ref 98–111)
Creatinine, Ser: 0.71 mg/dL (ref 0.44–1.00)
GFR, Estimated: >60 mL/min (ref >60)
Glucose, Bld: 152 mg/dL — ABNORMAL HIGH (ref 70–99)
Potassium: 3.5 mmol/L (ref 3.5–5.1)
Sodium: 141 mmol/L (ref 135–145)
Total Bilirubin: 0.5 mg/dL (ref 0.3–1.2)
Total Protein: 7.6 g/dL (ref 6.5–8.1)

## 2022-06-29 LAB — CBC
HCT: 42.5 % (ref 36.0–46.0)
Hemoglobin: 14.5 g/dL (ref 12.0–15.0)
MCH: 30 pg (ref 26.0–34.0)
MCHC: 34.1 g/dL (ref 30.0–36.0)
MCV: 87.8 fL (ref 80.0–100.0)
Platelets: 247 10*3/uL (ref 150–400)
RBC: 4.84 MIL/uL (ref 3.87–5.11)
RDW: 12.3 % (ref 11.5–15.5)
WBC: 13.9 10*3/uL — ABNORMAL HIGH (ref 4.0–10.5)
nRBC: 0 % (ref 0.0–0.2)

## 2022-06-29 LAB — LIPASE, BLOOD: Lipase: 16 U/L (ref 11–51)

## 2022-06-29 LAB — PREGNANCY, URINE: Preg Test, Ur: NEGATIVE

## 2022-06-29 MED ORDER — LOPERAMIDE HCL 2 MG PO CAPS: 2.0000 mg | ORAL_CAPSULE | Freq: Four times a day (QID) | ORAL | 0 refills | Status: DC | PRN

## 2022-06-29 MED ORDER — ONDANSETRON HCL 4 MG/2ML IJ SOLN
4.0000 mg | Freq: Once | INTRAMUSCULAR | Status: AC
  Administered 2022-06-29: 4 mg via INTRAVENOUS
  Filled 2022-06-29: qty 2

## 2022-06-29 MED ORDER — ONDANSETRON 4 MG PO TBDP: 4.0000 mg | ORAL_TABLET | Freq: Three times a day (TID) | ORAL | 0 refills | Status: DC | PRN

## 2022-06-29 MED ORDER — MORPHINE SULFATE (PF) 4 MG/ML IV SOLN
4.0000 mg | Freq: Once | INTRAVENOUS | Status: AC
  Administered 2022-06-29: 4 mg via INTRAVENOUS
  Filled 2022-06-29: qty 1

## 2022-06-29 MED ORDER — IOHEXOL 300 MG/ML  SOLN
100.0000 mL | Freq: Once | INTRAMUSCULAR | Status: AC | PRN
  Administered 2022-06-29: 100 mL via INTRAVENOUS

## 2022-06-29 MED ORDER — SODIUM CHLORIDE 0.9 % IV BOLUS
1000.0000 mL | Freq: Once | INTRAVENOUS | Status: AC
  Administered 2022-06-29: 1000 mL via INTRAVENOUS

## 2022-06-29 MED ORDER — DICYCLOMINE HCL 20 MG PO TABS: 20.0000 mg | ORAL_TABLET | Freq: Three times a day (TID) | ORAL | 0 refills | Status: DC | PRN

## 2022-06-29 NOTE — ED Notes (Signed)
Dr Long at bedside

## 2022-06-29 NOTE — Discharge Instructions (Signed)

## 2022-06-29 NOTE — ED Provider Notes (Signed)
Emergency Department Provider Note   I have reviewed the triage vital signs and the nursing notes.   HISTORY  Chief Complaint Abdominal Pain   HPI Ashley Livingston is a 52 y.o. female past history reviewed below presents emergency department with acute onset periumbilical abdominal pain with slight radiation to the left.  Symptoms began abruptly this evening without clear provocation.  She has had multiple episodes of vomiting and diarrhea.  No similar pain in the past.  No family members with similar symptoms.  No fevers or chills.  No dysuria, hesitancy, urgency.  No vaginal bleeding or discharge.  Denies any alcohol or drugs.  No prior abdominal surgical history.  Past Medical History:  Diagnosis Date   Glaucoma    Hepatitis A    History of kidney stones    Migraines    Pneumonia     Review of Systems  Constitutional: No fever/chills Cardiovascular: Denies chest pain. Respiratory: Denies shortness of breath. Gastrointestinal: Positive abdominal pain. Positive nausea, Vomiting, and diarrhea.  No constipation. Genitourinary: Negative for dysuria. Musculoskeletal: Negative for back pain. Skin: Negative for rash. Neurological: Negative for headaches.  ____________________________________________   PHYSICAL EXAM:  VITAL SIGNS: ED Triage Vitals  Enc Vitals Group     BP 06/29/22 0004 (!) 96/34     Pulse Rate 06/29/22 0004 (!) 124     Resp 06/29/22 0004 18     Temp 06/29/22 0008 98.2 F (36.8 C)     Temp Source 06/29/22 0008 Oral     SpO2 06/29/22 0004 98 %     Weight 06/29/22 0000 145 lb (65.8 kg)     Height 06/29/22 0000  (1.702 m)   Constitutional: Alert and oriented. Appears uncomfortable.  Eyes: Conjunctivae are normal.  Head: Atraumatic. Nose: No congestion/rhinnorhea. Mouth/Throat: Mucous membranes are moist.  Neck: No stridor.  Cardiovascular: Tachycardia. Good peripheral circulation. Grossly normal heart sounds.   Respiratory: Normal  respiratory effort.  No retractions. Lungs CTAB. Gastrointestinal: Soft with mild tenderness in the mid abdomen and LUQ. No peritonitis. No distention.  Musculoskeletal: No gross deformities of extremities. Neurologic:  Normal speech and language.  Skin:  Skin is warm, dry and intact. No rash noted.  ____________________________________________   LABS (all labs ordered are listed, but only abnormal results are displayed)  Labs Reviewed  COMPREHENSIVE METABOLIC PANEL - Abnormal; Notable for the following components:      Result Value   CO2 21 (*)    Glucose, Bld 152 (*)    AST 51 (*)    All other components within normal limits  CBC - Abnormal; Notable for the following components:   WBC 13.9 (*)    All other components within normal limits  URINALYSIS, ROUTINE W REFLEX MICROSCOPIC - Abnormal; Notable for the following components:   Color, Urine COLORLESS (*)    Specific Gravity, Urine >1.046 (*)    All other components within normal limits  LIPASE, BLOOD  PREGNANCY, URINE   ____________________________________________  EKG   EKG Interpretation  Date/Time:  Saturday June 29 2022 00:18:22 EDT Ventricular Rate:  90 PR Interval:  144 QRS Duration: 101 QT Interval:  370 QTC Calculation: 453 R Axis:   82 Text Interpretation: Sinus rhythm Probable left atrial enlargement Left ventricular hypertrophy Confirmed by Alona Bene [phone]) on 06/29/2022 12:22:39 AM        ____________________________________________  RADIOLOGY  CT ABDOMEN PELVIS W CONTRAST  Result Date: 06/29/2022 CLINICAL DATA:  Diffuse abdominal pain. EXAM: CT ABDOMEN AND  PELVIS WITH CONTRAST TECHNIQUE: Multidetector CT imaging of the abdomen and pelvis was performed using the standard protocol following bolus administration of intravenous contrast. RADIATION DOSE REDUCTION: This exam was performed according to the departmental dose-optimization program which includes automated exposure control, adjustment of  the mA and/or kV according to patient size and/or use of iterative reconstruction technique. CONTRAST:  OMNIPAQUE IOHEXOL 300 MG/ML  SOLN COMPARISON:  September 03, 2017 FINDINGS: Lower chest: No acute abnormality. Hepatobiliary: No focal liver abnormality is seen. No gallstones, gallbladder wall thickening, or biliary dilatation. Pancreas: Unremarkable. No pancreatic ductal dilatation or surrounding inflammatory changes. Spleen: Normal in size without focal abnormality. Adrenals/Urinary Tract: Adrenal glands are unremarkable. Kidneys are normal in size, without obstructing renal calculi or hydronephrosis. A 3 mm nonobstructing renal calculus is seen within the right kidney. A subcentimeter simple cyst is noted within the mid to lower left kidney. Bladder is unremarkable. Stomach/Bowel: Stomach is within normal limits. Appendix appears normal. No evidence of bowel wall thickening, distention, or inflammatory changes. Noninflamed diverticula are seen involving the descending and sigmoid colon. Vascular/Lymphatic: No significant vascular findings are present. No enlarged abdominal or pelvic lymph nodes. Reproductive: Tortuous and dilated vessels are seen along the lateral aspect of an otherwise normal appearing uterus. The bilateral adnexa are unremarkable. Other: No abdominal wall hernia or abnormality. No abdominopelvic ascites. Musculoskeletal: No acute or significant osseous findings. IMPRESSION: 1. 3 mm nonobstructing right renal calculus. 2. Colonic diverticulosis. Electronically Signed   By: Aram Candela M.D.   On: 06/29/2022 01:13    ____________________________________________   PROCEDURES  Procedure(s) performed:   Procedures  None  ____________________________________________   INITIAL IMPRESSION / ASSESSMENT AND PLAN / ED COURSE  Pertinent labs & imaging results that were available during my care of the patient were reviewed by me and considered in my medical decision making (see  chart for details).   This patient is Presenting for Evaluation of abdominal pain, which does require a range of treatment options, and is a complaint that involves a high risk of morbidity and mortality.  The Differential Diagnoses includes but is not exclusive to acute cholecystitis, intrathoracic causes for epigastric abdominal pain, gastritis, duodenitis, pancreatitis, small bowel or large bowel obstruction, abdominal aortic aneurysm, hernia, gastritis, etc.   Critical Interventions-    Medications  sodium chloride 0.9 % bolus 1,000 mL (0 mLs Intravenous Stopped 06/29/22 0130)  ondansetron (ZOFRAN) injection 4 mg (4 mg Intravenous Given 06/29/22 0032)  morphine (PF) 4 MG/ML injection 4 mg (4 mg Intravenous Given 06/29/22 0033)  iohexol (OMNIPAQUE) 300 MG/ML solution 100 mL (100 mLs Intravenous Contrast Given 06/29/22 0053)  morphine (PF) 4 MG/ML injection 4 mg (4 mg Intravenous Given 06/29/22 0130)    Reassessment after intervention: symptoms improved.     Clinical Laboratory Tests Ordered, included CBC with leukocytosis to 13.9. No AKI. Normal LFTs and lipase.   Radiologic Tests Ordered, included CT abdomen/pelvis. I independently interpreted the images and agree with radiology interpretation.   Cardiac Monitor Tracing which shows tachycardia.    Social Determinants of Health Risk patient is a non-smoker.   Medical Decision Making: Summary:  Patient presents to the emergency department with abdominal pain, vomiting, diarrhea.  Mild tenderness on exam but overall reassuring.  Patient does appear fairly uncomfortable, however.  Plan for labs and CT abdomen pelvis.  Reevaluation with update and discussion with patient. Labs and imaging reassuring. Suspect gastritis clinically. Will treat symptoms and push PO fluids with plan to advance diet as tolerated.  Patient's presentation is most consistent with acute presentation with potential threat to life or bodily function.    Disposition: discharge  ____________________________________________  FINAL CLINICAL IMPRESSION(S) / ED DIAGNOSES  Final diagnoses:  Generalized abdominal pain     NEW OUTPATIENT MEDICATIONS STARTED DURING THIS VISIT:  Discharge Medication List as of 06/29/2022  2:46 AM     START taking these medications   Details  dicyclomine (BENTYL) 20 MG tablet Take 1 tablet (20 mg total) by mouth 3 (three) times daily as needed for spasms., Starting Sat 06/29/2022, Normal    loperamide (IMODIUM) 2 MG capsule Take 1 capsule (2 mg total) by mouth 4 (four) times daily as needed for diarrhea or loose stools., Starting Sat 06/29/2022, Normal    ondansetron (ZOFRAN-ODT) 4 MG disintegrating tablet Take 1 tablet (4 mg total) by mouth every 8 (eight) hours as needed., Starting Sat 06/29/2022, Normal        Note:  This document was prepared using Dragon voice recognition software and may include unintentional dictation errors.  Alona Bene, MD, [facility] Emergency [facility]    Willian Donson, Arlyss Repress, MD 06/29/22 [phone]

## 2022-08-26 ENCOUNTER — Encounter: Payer: Self-pay | Admitting: Family Medicine

## 2022-08-26 DIAGNOSIS — N950 Postmenopausal bleeding: Secondary | ICD-10-CM

## 2022-09-15 ENCOUNTER — Encounter (HOSPITAL_BASED_OUTPATIENT_CLINIC_OR_DEPARTMENT_OTHER): Payer: Self-pay

## 2022-09-15 ENCOUNTER — Emergency Department (HOSPITAL_BASED_OUTPATIENT_CLINIC_OR_DEPARTMENT_OTHER)
Admission: EM | Admit: 2022-09-15 | Discharge: 2022-09-15 | Disposition: A | Discharge status: Home / Self Care | Payer: No Typology Code available for payment source | Attending: Emergency Medicine | Admitting: Emergency Medicine

## 2022-09-15 ENCOUNTER — Other Ambulatory Visit: Payer: Self-pay

## 2022-09-15 DIAGNOSIS — Z2914 Encounter for prophylactic rabies immune globin: Secondary | ICD-10-CM | POA: Diagnosis not present

## 2022-09-15 DIAGNOSIS — S81852A Open bite, left lower leg, initial encounter: Secondary | ICD-10-CM | POA: Diagnosis not present

## 2022-09-15 DIAGNOSIS — T148XXA Other injury of unspecified body region, initial encounter: Secondary | ICD-10-CM

## 2022-09-15 DIAGNOSIS — Z23 Encounter for immunization: Secondary | ICD-10-CM | POA: Diagnosis not present

## 2022-09-15 DIAGNOSIS — W5581XA Bitten by other mammals, initial encounter: Secondary | ICD-10-CM | POA: Insufficient documentation

## 2022-09-15 MED ORDER — RABIES IMMUNE GLOBULIN 150 UNIT/ML IM INJ
20.0000 [IU]/kg | INJECTION | Freq: Once | INTRAMUSCULAR | Status: AC
  Administered 2022-09-15: 1275 [IU]
  Filled 2022-09-15: qty 50

## 2022-09-15 MED ORDER — TETANUS-DIPHTH-ACELL PERTUSSIS 5-2.5-18.5 LF-MCG/0.5 IM SUSY
0.5000 mL | PREFILLED_SYRINGE | Freq: Once | INTRAMUSCULAR | Status: AC
  Administered 2022-09-15: 0.5 mL via INTRAMUSCULAR
  Filled 2022-09-15: qty 0.5

## 2022-09-15 MED ORDER — AMOXICILLIN-POT CLAVULANATE 875-125 MG PO TABS: 1.0000 | ORAL_TABLET | Freq: Two times a day (BID) | ORAL | 0 refills | Status: DC

## 2022-09-15 MED ORDER — RABIES VACCINE, PCEC IM SUSR
1.0000 mL | Freq: Once | INTRAMUSCULAR | Status: AC
  Administered 2022-09-15: 1 mL via INTRAMUSCULAR
  Filled 2022-09-15: qty 1

## 2022-09-15 MED ORDER — AMOXICILLIN-POT CLAVULANATE 875-125 MG PO TABS
1.0000 | ORAL_TABLET | Freq: Once | ORAL | Status: AC
  Administered 2022-09-15: 1 via ORAL
  Filled 2022-09-15: qty 1

## 2022-09-15 NOTE — ED Provider Notes (Signed)
[place] EMERGENCY DEPARTMENT AT [facility] Provider Note   CSN: [id number] Arrival date & time: 09/15/22  1947     History  Chief Complaint  Patient presents with   Animal Bite    Ashley Livingston is a 52 y.o. female, no pertinent past medical history, who presents to the ED secondary to being bitten by a fox today when she was on a walk.  She states this happened around 730 tonight.  She states that she was bitten by a fox on the left anterior lower leg.  Tetanus unknown.  Denies any pain other than the swelling around the site.     Home Medications Prior to Admission medications   Medication Sig Start Date End Date Taking? Authorizing Provider  amoxicillin-clavulanate (AUGMENTIN) 875-125 MG tablet Take 1 tablet by mouth every 12 (twelve) hours. 09/15/22  Yes Asheley Hellberg L, PA  carboxymethylcellulose (REFRESH PLUS) 0.5 % SOLN 1 drop 3 (three) times daily as needed.    [provider]  dicyclomine (BENTYL) 20 MG tablet Take 1 tablet (20 mg total) by mouth 3 (three) times daily as needed for spasms. 06/29/22   Long, Arlyss Repress, MD  dorzolamide-timolol (COSOPT) 22.3-6.8 MG/ML ophthalmic solution 1 drop 2 (two) times daily.    [provider]  loperamide (IMODIUM) 2 MG capsule Take 1 capsule (2 mg total) by mouth 4 (four) times daily as needed for diarrhea or loose stools. 06/29/22   Long, Arlyss Repress, MD  ondansetron (ZOFRAN-ODT) 4 MG disintegrating tablet Take 1 tablet (4 mg total) by mouth every 8 (eight) hours as needed. 06/29/22   Long, Arlyss Repress, MD  rizatriptan (MAXALT-MLT) 10 MG disintegrating tablet Take 1 tablet (10 mg total) by mouth as needed for migraine. May repeat in 2 hours if needed 02/26/22   Willow Ora, MD      Allergies    Jonne Ply [aspirin] and Other    Review of Systems   Review of Systems  Skin:  Positive for wound.    Physical Exam Updated Vital Signs BP (!) 148/88 (BP Location: Right Arm)   Pulse 84   Temp 98.3 F (36.8  C) (Oral)   Resp 16   Ht 5\' 8"  (1.727 m)   Wt 62.6 kg   SpO2 98%   BMI 20.98 kg/m  Physical Exam Vitals and nursing note reviewed.  Constitutional:      General: She is not in acute distress.    Appearance: She is well-developed.  HENT:     Head: Normocephalic and atraumatic.  Eyes:     Conjunctiva/sclera: Conjunctivae normal.  Cardiovascular:     Rate and Rhythm: Normal rate and regular rhythm.     Heart sounds: No murmur heard. Pulmonary:     Effort: Pulmonary effort is normal. No respiratory distress.     Breath sounds: Normal breath sounds.  Abdominal:     Palpations: Abdomen is soft.     Tenderness: There is no abdominal tenderness.  Musculoskeletal:        General: No swelling.     Cervical back: Neck supple.  Skin:    General: Skin is warm and dry.     Capillary Refill: Capillary refill takes less than 2 seconds.     Comments: 3 cm abrasion to left anterior leg.  With mild swelling.  Neurological:     Mental Status: She is alert.  Psychiatric:        Mood and Affect: Mood normal.  ED Results / Procedures / Treatments   Labs (all labs ordered are listed, but only abnormal results are displayed) Labs Reviewed - No data to display  EKG None  Radiology No results found.  Procedures Procedures    Medications Ordered in ED Medications  rabies immune globulin (HYPERRAB/KEDRAB) injection 1,275 Units (1,275 Units Infiltration Given 09/15/22 2256)  rabies vaccine (RABAVERT) injection 1 mL (1 mL Intramuscular Given 09/15/22 2254)  Tdap (BOOSTRIX) injection 0.5 mL (0.5 mLs Intramuscular Given 09/15/22 2253)  amoxicillin-clavulanate (AUGMENTIN) 875-125 MG per tablet 1 tablet (1 tablet Oral Given 09/15/22 2251)    ED Course/ Medical Decision Making/ A&P                             Medical Decision Making Patient is a 52 year old female, here for a fox bite that occurred around 7:30 PM tonight.  She states the fox came out of nowhere.  Unknown last tetanus.   We will start her on Augmentin, update her tetanus, and give her rabies prophylaxis.  We discussed return precautions she voiced understanding.  Augmentin sent to the pharmacy.  I instructed her she will need to follow-up on days 3, 7, and 14 for rabies vaccines.  Risk Prescription drug management.    Final Clinical Impression(s) / ED Diagnoses Final diagnoses:  Animal bite    Rx / DC Orders ED Discharge Orders          Ordered    amoxicillin-clavulanate (AUGMENTIN) 875-125 MG tablet  Every 12 hours        09/15/22 2255              Pete Pelt, [place] 09/15/22 2317    Tegeler, Canary Brim, MD 09/15/22 [phone]

## 2022-09-15 NOTE — Discharge Instructions (Addendum)
                                  RABIES VACCINE FOLLOW UP  Patient's Name: Ashley Livingston                     Original Order Date:09/15/2022  Medical Record Number: [id number]  ED Physician: Heide Scales, * Primary Diagnosis: Rabies Exposure       PCP: Willow Ora, MD  Patient Phone Number: (home) [phone] (home)    (cell)  Telephone Information:  Mobile [phone]    (work) There is no work phone number on file. Species of Animal:     You have been seen in the Emergency Department for a possible rabies exposure. It's very important you return for the additional vaccine doses.  Please call the clinic listed below for hours of operation.   Clinic that will administer your rabies vaccines:    DAY 0:  09/15/2022      DAY 3:  09/18/2022       DAY 7:  09/22/2022     DAY 14:  09/29/2022         The 5th vaccine injection is considered for immune compromised patients only.  DAY 28:  10/13/2022       Please take the antibiotics completely.  Return to the ER if you feel like you are developing redness, warmth, swelling to the area.  You can use ice on the area, to help with the swelling and pain.

## 2022-09-15 NOTE — ED Triage Notes (Signed)
Pt reports she was attacked and bitten by a fox today when she was on a walk, this happened at 7 today. She has swelling and puncture wound to left anterior lower leg.

## 2023-02-05 ENCOUNTER — Encounter: Payer: Self-pay | Admitting: Family Medicine

## 2023-02-05 ENCOUNTER — Ambulatory Visit (INDEPENDENT_AMBULATORY_CARE_PROVIDER_SITE_OTHER): Payer: No Typology Code available for payment source | Admitting: Family Medicine

## 2023-02-05 ENCOUNTER — Other Ambulatory Visit (HOSPITAL_COMMUNITY)
Admission: RE | Admit: 2023-02-05 | Discharge: 2023-02-05 | Disposition: A | Discharge status: Home / Self Care | Payer: No Typology Code available for payment source | Source: Ambulatory Visit | Attending: Family Medicine | Admitting: Family Medicine

## 2023-02-05 ENCOUNTER — Telehealth: Payer: Self-pay | Admitting: Family Medicine

## 2023-02-05 ENCOUNTER — Other Ambulatory Visit: Payer: Self-pay

## 2023-02-05 VITALS — BP 100/70 | HR 69 | Temp 98.2°F | Ht 68.0 in | Wt 138.6 lb

## 2023-02-05 DIAGNOSIS — N841 Polyp of cervix uteri: Secondary | ICD-10-CM

## 2023-02-05 DIAGNOSIS — Z124 Encounter for screening for malignant neoplasm of cervix: Secondary | ICD-10-CM | POA: Diagnosis not present

## 2023-02-05 DIAGNOSIS — Z13220 Encounter for screening for lipoid disorders: Secondary | ICD-10-CM

## 2023-02-05 DIAGNOSIS — Z0000 Encounter for general adult medical examination without abnormal findings: Secondary | ICD-10-CM | POA: Diagnosis not present

## 2023-02-05 DIAGNOSIS — N950 Postmenopausal bleeding: Secondary | ICD-10-CM | POA: Diagnosis not present

## 2023-02-05 DIAGNOSIS — G43009 Migraine without aura, not intractable, without status migrainosus: Secondary | ICD-10-CM | POA: Diagnosis not present

## 2023-02-05 DIAGNOSIS — Z0001 Encounter for general adult medical examination with abnormal findings: Secondary | ICD-10-CM | POA: Insufficient documentation

## 2023-02-05 DIAGNOSIS — N959 Unspecified menopausal and perimenopausal disorder: Secondary | ICD-10-CM

## 2023-02-05 DIAGNOSIS — N200 Calculus of kidney: Secondary | ICD-10-CM

## 2023-02-05 LAB — CBC WITH DIFFERENTIAL/PLATELET
Basophils Absolute: 0.1 10*3/uL (ref 0.0–0.1)
Basophils Relative: 1.2 % (ref 0.0–3.0)
Eosinophils Absolute: 0 10*3/uL (ref 0.0–0.7)
Eosinophils Relative: 0.4 % (ref 0.0–5.0)
HCT: 40.6 % (ref 36.0–46.0)
Hemoglobin: 13.5 g/dL (ref 12.0–15.0)
Lymphocytes Relative: 25.3 % (ref 12.0–46.0)
Lymphs Abs: 1.2 10*3/uL (ref 0.7–4.0)
MCHC: 33.4 g/dL (ref 30.0–36.0)
MCV: 89.8 fL (ref 78.0–100.0)
Monocytes Absolute: 0.3 10*3/uL (ref 0.1–1.0)
Monocytes Relative: 5.5 % (ref 3.0–12.0)
Neutro Abs: 3.1 10*3/uL (ref 1.4–7.7)
Neutrophils Relative %: 67.6 % (ref 43.0–77.0)
Platelets: 215 10*3/uL (ref 150.0–400.0)
RBC: 4.52 Mil/uL (ref 3.87–5.11)
RDW: 13.1 % (ref 11.5–15.5)
WBC: 4.6 10*3/uL (ref 4.0–10.5)

## 2023-02-05 LAB — LIPID PANEL
Cholesterol: 271 mg/dL — ABNORMAL HIGH (ref 0–200)
HDL: 79.8 mg/dL (ref >39.00)
LDL Cholesterol: 181 mg/dL — ABNORMAL HIGH (ref 0–99)
NonHDL: 191.28
Total CHOL/HDL Ratio: 3
Triglycerides: 52 mg/dL (ref 0.0–149.0)
VLDL: 10.4 mg/dL (ref 0.0–40.0)

## 2023-02-05 LAB — COMPREHENSIVE METABOLIC PANEL
ALT: 21 U/L (ref 0–35)
AST: 27 U/L (ref 0–37)
Albumin: 4.6 g/dL (ref 3.5–5.2)
Alkaline Phosphatase: 98 U/L (ref 39–117)
BUN: 14 mg/dL (ref 6–23)
CO2: 29 meq/L (ref 19–32)
Calcium: 9.4 mg/dL (ref 8.4–10.5)
Chloride: 103 meq/L (ref 96–112)
Creatinine, Ser: 0.8 mg/dL (ref 0.40–1.20)
GFR: 83.47 mL/min (ref >60.00)
Glucose, Bld: 86 mg/dL (ref 70–99)
Potassium: 3.8 meq/L (ref 3.5–5.1)
Sodium: 138 meq/L (ref 135–145)
Total Bilirubin: 0.5 mg/dL (ref 0.2–1.2)
Total Protein: 7.5 g/dL (ref 6.0–8.3)

## 2023-02-05 LAB — TSH: TSH: 1.52 u[IU]/mL (ref 0.35–5.50)

## 2023-02-05 MED ORDER — RIZATRIPTAN BENZOATE 10 MG PO TBDP: 10.0000 mg | ORAL_TABLET | ORAL | 3 refills | Status: DC | PRN

## 2023-02-05 NOTE — Patient Instructions (Signed)
Please return in 12 months for your annual complete physical; please come fasting.   I will release your lab results to you on your MyChart account with further instructions. You may see the results before I do, but when I review them I will send you a message with my report or have my assistant call you if things need to be discussed. Please reply to my message with any questions. Thank you!   If you have any questions or concerns, please don't hesitate to send me a message via MyChart or call the office at [phone]. Thank you for visiting with Ashley Livingston today! It's our pleasure caring for you.   VISIT SUMMARY:  During your annual physical exam, we discussed your overall good health and new health routine, including intermittent fasting and daily walking. We addressed your postmenopausal bleeding, menopausal symptoms, and migraines. We also reviewed your general health maintenance and vaccination status.  YOUR PLAN:  -ANNUAL PHYSICAL EXAM: Your routine annual physical exam was performed, and you reported overall good health with adherence to a new health routine. We conducted a physical exam, ordered blood work, and performed a Pap smear.  -POSTMENOPAUSAL BLEEDING: Postmenopausal bleeding can be a sign of various conditions, including potential early precancerous changes. A cervical polyp was identified during your exam, which is likely causing the bleeding. You should follow up with a gynecologist for polyp removal and further evaluation.  -MENOPAUSAL SYMPTOMS: Menopausal symptoms like hot flashes and sleep disturbances are common and can be managed with lifestyle changes. We discussed the potential benefits and risks of using black cohosh for hot flashes and valerian root for sleep. You may consider these herbal supplements for additional relief. BLACK COHASH OR VALLERIAN ROOT CAN BE HELPFUL  -MIGRAINE: Migraines are severe headaches that can cause significant discomfort. Your migraines are  well-controlled after a recent medication adjustment, and no new episodes have been reported.  -GENERAL HEALTH MAINTENANCE: You are up to date on your vaccinations. We discussed your dietary habits and exercise routine, and no additional vaccinations are needed at this time. Continue with your current diet and exercise regimen.  INSTRUCTIONS:  Please follow up with lab results after I return from vacation. Additionally, schedule a gynecology appointment for polyp removal and further evaluation.

## 2023-02-05 NOTE — Telephone Encounter (Signed)
Rx sent 

## 2023-02-05 NOTE — Telephone Encounter (Signed)
Prescription Request  02/05/2023  LOV: 02/05/2023  What is the name of the medication or equipment? rizatriptan (MAXALT-MLT) 10 MG disintegrating tablet   Have you contacted your pharmacy to request a refill? Yes   Which pharmacy would you like this sent to?  [id number] [place], [place] - [address] AT [facility] OF [facility] [address] [facility] [address] [place] [address] Phone: [phone] Fax: [phone]     Patient notified that their request is being sent to the clinical staff for review and that they should receive a response within 2 business days.   Please advise at Mobile [phone] (mobile)

## 2023-02-05 NOTE — Progress Notes (Signed)
Subjective  Chief Complaint  Patient presents with   Annual Exam    HPI: Ashley Livingston is a 52 y.o. female who presents to [facility] at [place] today for a Female Wellness Visit. She also has the concerns and/or needs as listed above in the chief complaint. These will be addressed in addition to the Health Maintenance Visit.   Wellness Visit: annual visit with health maintenance review and exam  HM: healthy. Due pap today. Mammo current. Imms current. Declines flu vaccine.  Eating a healthy diet.  Practicing intermittent fasting, typically a 6-hour eating window.  This is working well for her.  Weight is stable.  She is walking approximately 7 hours daily.  This also helps her menopausal symptoms Chronic disease f/u and/or acute problem visit: (deemed necessary to be done in addition to the wellness visit): Migraines: Currently well-controlled with intermittent Maxalt use.  She did suffer from repeated migraines for approximately 3 weeks after having an intense eye exam for her glaucoma.  This is calm down.  She feels well Postmenopausal bleeding with normal transvaginal ultrasound done last year.  She did experience 1 more episode of spotting.  Was unable to get in with a gynecologist. Kidney stone requiring ER evaluation earlier this year.  Reviewed notes   Assessment  1. Encounter for well adult exam with abnormal findings   2. Cervical cancer screening   3. Nephrolithiasis   4. Migraine without aura and without status migrainosus, not intractable   5. Cervical polyp   6. Postmenopausal vaginal bleeding   7. Postmenopausal symptoms      Plan  Female Wellness Visit: Age appropriate Health Maintenance and Prevention measures were discussed with patient. Included topics are cancer screening recommendations, ways to keep healthy (see AVS) including dietary and exercise recommendations, regular eye and dental care, use of seat belts, and avoidance of moderate  alcohol use and tobacco use.  Pap smear fecomas HPV screening done today BMI: discussed patient's BMI and encouraged positive lifestyle modifications to help get to or maintain a target BMI. HM needs and immunizations were addressed and ordered. See below for orders. See HM and immunization section for updates.  Declines flu vaccine Routine labs and screening tests ordered including cmp, cbc and lipids where appropriate. Discussed recommendations regarding Vit D and calcium supplementation (see AVS)  Chronic disease management visit and/or acute problem visit: Kidney stones: Reviewed notes. Migraines are well-controlled with abortive Maxalt as needed. Postmenopausal bleeding and cervical polyp: This is the likely cause of her 2 episodes of spotting.  However, I do recommend seeing a gynecologist for polyp removal and possibly further sonographic testing if symptoms recur.  Patient understands and agrees. Menopausal vasomotor symptoms: Patient defers prescription medication treatments.  She does well with exercise which seems to help.  Avoids alcohol.  Can consider black cohosh or valerian root as well. Healthy diet healthy lifestyle, follow-up on lipids today.  Reassured  Follow up: 12 months for complete physical Orders Placed This Encounter  Procedures   CBC with Differential/Platelet   Comprehensive metabolic panel   Lipid panel   TSH   No orders of the defined types were placed in this encounter.     Body mass index is 21.07 kg/m. Wt Readings from Last 3 Encounters:  02/05/23 138 lb 9.6 oz (62.9 kg)  09/15/22 138 lb (62.6 kg)  06/29/22 145 lb (65.8 kg)     Patient Active Problem List   Diagnosis Date Noted Date Diagnosed  Postmenopausal bleeding 02/13/2022     TVUS 01/2022: endometrial thickness <5 mm; benign appearing.  Needs sonohysterogram if recurs to r/o polyp.     Glaucoma 12/07/2019    Adhesive capsulitis of left shoulder 12/08/2017    Nephrolithiasis 10/21/2017     Migraine without aura and without status migrainosus, not intractable 05/27/2016    Health Maintenance  Topic Date Due   Cervical Cancer Screening (HPV/Pap Cotest)  10/22/2022   COVID-19 Vaccine (4 - 2023-24 season) 02/21/2023 (Originally 11/17/2022)   MAMMOGRAM  03/23/2023   Fecal DNA (Cologuard)  03/06/2024   DTaP/Tdap/Td (3 - Td or Tdap) 09/14/2032   Hepatitis C Screening  Completed   HIV Screening  Completed   Zoster Vaccines- Shingrix  Completed   HPV VACCINES  Aged Out   INFLUENZA VACCINE  Discontinued   Immunization History  Administered Date(s) Administered   PFIZER(Purple Top)SARS-COV-2 Vaccination 05/27/2019, 06/21/2019, 01/03/2020   Rabies, IM 09/15/2022   Tdap 01/27/2020, 09/15/2022   Zoster Recombinant(Shingrix) 01/29/2021, 04/25/2021   We updated and reviewed the patient's past history in detail and it is documented below. Allergies: Patient is allergic to asa [aspirin] and other. Past Medical History Patient  has a past medical history of Glaucoma, Hepatitis A, History of kidney stones, Migraines, and Pneumonia. Past Surgical History Patient  has a past surgical history that includes Shoulder arthroscopy (Right); Repair peroneal tendons ankle (Right); Lithotripsy (09/25/2017); Cervical polypectomy; and Extracorporeal shock wave lithotripsy (Left, 09/25/2017). Family History: Patient family history includes Basal cell carcinoma in her father; Healthy in her mother; Hyperlipidemia in her brother and father; Migraines in her brother, father, and sister; Pulmonary embolism in her brother. Social History:  Patient  reports that she has never smoked. She has never used smokeless tobacco. She reports current alcohol use. She reports that she does not use drugs.  Review of Systems: Constitutional: negative for fever or malaise Ophthalmic: negative for photophobia, double vision or loss of vision Cardiovascular: negative for chest pain, dyspnea on exertion, or new LE  swelling Respiratory: negative for SOB or persistent cough Gastrointestinal: negative for abdominal pain, change in bowel habits or melena Genitourinary: negative for dysuria or gross hematuria, no abnormal uterine bleeding or disharge Musculoskeletal: negative for new gait disturbance or muscular weakness Integumentary: negative for new or persistent rashes, no breast lumps Neurological: negative for TIA or stroke symptoms Psychiatric: negative for SI or delusions Allergic/Immunologic: negative for hives  Patient Care Team    Relationship Specialty Notifications Start End  Willow Ora, MD PCP - General Family Medicine  08/19/17     Objective  Vitals: BP 100/70   Pulse 69   Temp 98.2 F (36.8 C)   Ht 5\' 8"  (1.727 m)   Wt 138 lb 9.6 oz (62.9 kg)   PF 97 L/min   BMI 21.07 kg/m  General:  Well developed, well nourished, no acute distress  Psych:  Alert and orientedx3,normal mood and affect HEENT:  Normocephalic, atraumatic, non-icteric sclera,  supple neck without adenopathy, mass or thyromegaly Cardiovascular:  Normal S1, S2, RRR without gallop, rub or murmur Respiratory:  Good breath sounds bilaterally, CTAB with normal respiratory effort Gastrointestinal: normal bowel sounds, soft, non-tender, no noted masses. No HSM MSK: extremities without edema, joints without erythema or swelling Neurologic:    Mental status is normal.  Gross motor and sensory exams are normal.  No tremor Pelvic Exam: Normal external genitalia, no vulvar or vaginal lesions present. Clear cervix w/o CMT, polyp at cervical os is visible. Bimanual  exam reveals a nontender fundus w/o masses, nl size. No adnexal masses present. No inguinal adenopathy. A PAP smear was performed.    Commons side effects, risks, benefits, and alternatives for medications and treatment plan prescribed today were discussed, and the patient expressed understanding of the given instructions. Patient is instructed to call or message via  MyChart if he/she has any questions or concerns regarding our treatment plan. No barriers to understanding were identified. We discussed Red Flag symptoms and signs in detail. Patient expressed understanding regarding what to do in case of urgent or emergency type symptoms.  Medication list was reconciled, printed and provided to the patient in AVS. Patient instructions and summary information was reviewed with the patient as documented in the AVS. This note was prepared with assistance of Dragon voice recognition software. Occasional wrong-word or sound-a-like substitutions may have occurred due to the inherent limitations of voice recognition software

## 2023-02-06 NOTE — Progress Notes (Signed)
Labs reviewed.  The 10-year ASCVD risk score (Arnett DK, et al., 2019) is: 1.1%   Values used to calculate the score:     Age: 52 years     Sex: Female     Is Non-Hispanic African American: No     Diabetic: No     Tobacco smoker: No     Systolic Blood Pressure: 100 mmHg     Is BP treated: No     HDL Cholesterol: 79.8 mg/dL     Total Cholesterol: 271 mg/dL

## 2023-02-14 LAB — CYTOLOGY - PAP
Comment: NEGATIVE
Diagnosis: NEGATIVE
High risk HPV: NEGATIVE

## 2023-02-21 ENCOUNTER — Other Ambulatory Visit: Payer: Self-pay | Admitting: Family Medicine

## 2023-02-21 DIAGNOSIS — Z1231 Encounter for screening mammogram for malignant neoplasm of breast: Secondary | ICD-10-CM

## 2023-04-01 ENCOUNTER — Ambulatory Visit
Admission: RE | Admit: 2023-04-01 | Discharge: 2023-04-01 | Disposition: A | Discharge status: Home / Self Care | Payer: Commercial Managed Care - PPO | Source: Ambulatory Visit | Attending: Family Medicine | Admitting: Family Medicine

## 2023-04-01 DIAGNOSIS — Z1231 Encounter for screening mammogram for malignant neoplasm of breast: Secondary | ICD-10-CM

## 2023-04-09 ENCOUNTER — Other Ambulatory Visit: Payer: Self-pay | Admitting: Obstetrics & Gynecology

## 2023-04-10 LAB — SURGICAL PATHOLOGY

## 2024-02-06 ENCOUNTER — Ambulatory Visit (INDEPENDENT_AMBULATORY_CARE_PROVIDER_SITE_OTHER): Payer: No Typology Code available for payment source | Admitting: Family Medicine

## 2024-02-06 ENCOUNTER — Encounter: Payer: Self-pay | Admitting: Family Medicine

## 2024-02-06 VITALS — BP 100/86 | HR 70 | Temp 98.0°F | Ht 68.0 in | Wt 152.6 lb

## 2024-02-06 DIAGNOSIS — Z13220 Encounter for screening for lipoid disorders: Secondary | ICD-10-CM | POA: Diagnosis not present

## 2024-02-06 DIAGNOSIS — N959 Unspecified menopausal and perimenopausal disorder: Secondary | ICD-10-CM

## 2024-02-06 DIAGNOSIS — G43009 Migraine without aura, not intractable, without status migrainosus: Secondary | ICD-10-CM

## 2024-02-06 DIAGNOSIS — Z0001 Encounter for general adult medical examination with abnormal findings: Secondary | ICD-10-CM

## 2024-02-06 DIAGNOSIS — Z1211 Encounter for screening for malignant neoplasm of colon: Secondary | ICD-10-CM

## 2024-02-06 LAB — COMPREHENSIVE METABOLIC PANEL WITH GFR
ALT: 19 U/L (ref 0–35)
AST: 24 U/L (ref 0–37)
Albumin: 4.6 g/dL (ref 3.5–5.2)
Alkaline Phosphatase: 100 U/L (ref 39–117)
BUN: 14 mg/dL (ref 6–23)
CO2: 27 meq/L (ref 19–32)
Calcium: 9.5 mg/dL (ref 8.4–10.5)
Chloride: 103 meq/L (ref 96–112)
Creatinine, Ser: 0.73 mg/dL (ref 0.40–1.20)
GFR: 92.52 mL/min (ref >60.00)
Glucose, Bld: 93 mg/dL (ref 70–99)
Potassium: 4.4 meq/L (ref 3.5–5.1)
Sodium: 139 meq/L (ref 135–145)
Total Bilirubin: 0.5 mg/dL (ref 0.2–1.2)
Total Protein: 8 g/dL (ref 6.0–8.3)

## 2024-02-06 LAB — CBC WITH DIFFERENTIAL/PLATELET
Basophils Absolute: 0 K/uL (ref 0.0–0.1)
Basophils Relative: 0.7 % (ref 0.0–3.0)
Eosinophils Absolute: 0.1 K/uL (ref 0.0–0.7)
Eosinophils Relative: 1.3 % (ref 0.0–5.0)
HCT: 41.7 % (ref 36.0–46.0)
Hemoglobin: 14.2 g/dL (ref 12.0–15.0)
Lymphocytes Relative: 37.4 % (ref 12.0–46.0)
Lymphs Abs: 1.6 K/uL (ref 0.7–4.0)
MCHC: 34.1 g/dL (ref 30.0–36.0)
MCV: 86.8 fl (ref 78.0–100.0)
Monocytes Absolute: 0.3 K/uL (ref 0.1–1.0)
Monocytes Relative: 6.1 % (ref 3.0–12.0)
Neutro Abs: 2.3 K/uL (ref 1.4–7.7)
Neutrophils Relative %: 54.5 % (ref 43.0–77.0)
Platelets: 202 K/uL (ref 150.0–400.0)
RBC: 4.81 Mil/uL (ref 3.87–5.11)
RDW: 13 % (ref 11.5–15.5)
WBC: 4.2 K/uL (ref 4.0–10.5)

## 2024-02-06 LAB — TSH: TSH: 1.93 u[IU]/mL (ref 0.35–5.50)

## 2024-02-06 LAB — LIPID PANEL
Cholesterol: 271 mg/dL — ABNORMAL HIGH (ref 0–200)
HDL: 85.8 mg/dL (ref >39.00)
LDL Cholesterol: 175 mg/dL — ABNORMAL HIGH (ref 0–99)
NonHDL: 185.36
Total CHOL/HDL Ratio: 3
Triglycerides: 52 mg/dL (ref 0.0–149.0)
VLDL: 10.4 mg/dL (ref 0.0–40.0)

## 2024-02-06 MED ORDER — RIZATRIPTAN BENZOATE 10 MG PO TBDP: 10.0000 mg | ORAL_TABLET | ORAL | 3 refills | Status: AC | PRN

## 2024-02-06 NOTE — Progress Notes (Signed)
 Subjective  Chief Complaint  Patient presents with   Annual Exam    Pt here for Annual Exam and is currently fasting     HPI: Ashley Livingston is a 53 y.o. female who presents to [facility] at [facility] [place] today for a Female Wellness Visit. She also has the concerns and/or needs as listed above in the chief complaint. These will be addressed in addition to the Health Maintenance Visit.   Wellness Visit: annual visit with health maintenance review and exam  HM: pap and mammo are current. Due CRC screen with cologuard. Declines flu and prevnar: needle phobia, education given. Healthy lifestyle  Chronic disease f/u and/or acute problem visit: (deemed necessary to be done in addition to the wellness visit): Discussed the use of AI scribe software for clinical note transcription with the patient, who gave verbal consent to proceed.  History of Present Illness Ashley Livingston is a 53 year old female who presents with concerns about weight management and metabolic health.  Weight management and metabolic health, with normal BMI - Difficulty with weight management despite significant physical activity, including walking five to six miles daily, playing tennis, and walking her dog for two hours daily - Weight has remained unchanged since returning from a trip to [place] in October - Diet consists of oatmeal, fruits, and vegetables, with over 22 grams of fiber and adequate protein intake - Postmenopausal and not on hormone replacement therapy - Concern about increasing cholesterol levels over time Wt Readings from Last 3 Encounters:  02/06/24 152 lb 9.6 oz (69.2 kg)  02/05/23 138 lb 9.6 oz (62.9 kg)  09/15/22 138 lb (62.6 kg)    Migraine headaches - Occasional migraines - Recent episode lasted three days - Migraines respond well to maxalt . No new sxs or patterns. Have been stable overall.   Sleep disturbance - Frequent nighttime awakenings - Frustration with current health  status - likely postmenopause related     Assessment  1. Encounter for well adult exam with abnormal findings   2. Migraine without aura and without status migrainosus, not intractable   3. Screening for colorectal cancer   4. Postmenopausal symptoms      Plan  Female Wellness Visit: Age appropriate Health Maintenance and Prevention measures were discussed with patient. Included topics are cancer screening recommendations, ways to keep healthy (see AVS) including dietary and exercise recommendations, regular eye and dental care, use of seat belts, and avoidance of moderate alcohol use and tobacco use. Cologuard ordered BMI: discussed patient's BMI and encouraged positive lifestyle modifications to help get to or maintain a target BMI. HM needs and immunizations were addressed and ordered. See below for orders. See HM and immunization section for updates. Defers imms today Routine labs and screening tests ordered including cmp, cbc and lipids where appropriate. Discussed recommendations regarding Vit D and calcium supplementation (see AVS)  Chronic disease management visit and/or acute problem visit: Assessment and Plan Assessment & Plan Adult Wellness Visit Routine wellness visit with discussion on general health, lifestyle, and preventive care. - Ordered Cologuard for colorectal cancer screening - Discussed GLP-1 agonists for metabolic health and weight management - Discussed hormone replacement therapy options  Postmenopausal state with metabolic concerns Postmenopausal state with concerns about metabolic health, weight management, and cholesterol levels. Discussed hormone replacement therapy and GLP-1 agonists for metabolic support and weight management. Addressed concerns about hormone therapy safety and benefits, including improved energy, skin changes, sleep, anxiety, irritability, and libido. Discussed testosterone cream for  weight management and metabolic support. - consider  estrogen patch and progesterone micronized by mouth - Consider testosterone cream for weight management, libido and mm health  Migraine without aura Recent three-day migraine episode. Discussed potential benefits of Nurtec for migraine management. - Consider Nurtec for migraine management, however pt happy with current abortive care with maxalt , refilled. Episodic use - discussed migraines and estrogen therapy: pt's migraines improved while pregnant (5 children)  Glaucoma well-managed with current treatment regimen. - Continue current eye drop regimen    Follow up: 12 mo for cpe  Orders Placed This Encounter  Procedures   Cologuard   CBC with Differential/Platelet   Comprehensive metabolic panel with GFR   Lipid panel   TSH   Meds ordered this encounter  Medications   rizatriptan  (MAXALT -MLT) 10 MG disintegrating tablet    Sig: Take 1 tablet (10 mg total) by mouth as needed for migraine. May repeat in 2 hours if needed    Dispense:  20 tablet    Refill:  3      Body mass index is 23.2 kg/m. Wt Readings from Last 3 Encounters:  02/06/24 152 lb 9.6 oz (69.2 kg)  02/05/23 138 lb 9.6 oz (62.9 kg)  09/15/22 138 lb (62.6 kg)     Patient Active Problem List   Diagnosis Date Noted   Postmenopausal symptoms 02/06/2024   Postmenopausal bleeding 02/13/2022    TVUS 01/2022: endometrial thickness <5 mm; benign appearing.  Needs sonohysterogram if recurs to r/o polyp.     Glaucoma 12/07/2019   Adhesive capsulitis of left shoulder 12/08/2017   Nephrolithiasis 10/21/2017   Migraine without aura and without status migrainosus, not intractable 05/27/2016   Health Maintenance  Topic Date Due   Hepatitis B Vaccines 19-59 Average Risk (1 of 3 - 19+ 3-dose series) Never done   Mammogram  03/31/2024   COVID-19 Vaccine (4 - 2025-26 season) 02/22/2024 (Originally 11/17/2023)   Pneumococcal Vaccine: 50+ Years (1 of 1 - PCV) 02/05/2025 (Originally 07/30/2018)   Fecal DNA (Cologuard)   03/06/2024   Cervical Cancer Screening (HPV/Pap Cotest)  02/05/2028   DTaP/Tdap/Td (3 - Td or Tdap) 09/14/2032   Hepatitis C Screening  Completed   HIV Screening  Completed   Zoster Vaccines- Shingrix  Completed   HPV VACCINES  Aged Out   Meningococcal B Vaccine  Aged Out   Influenza Vaccine  Discontinued   Immunization History  Administered Date(s) Administered   PFIZER(Purple Top)SARS-COV-2 Vaccination 05/27/2019, 06/21/2019, 01/03/2020   Rabies, IM 09/15/2022   Tdap 01/27/2020, 09/15/2022   Zoster Recombinant(Shingrix) 01/29/2021, 04/25/2021   We updated and reviewed the patient's past history in detail and it is documented below. Allergies: Patient is allergic to asa [aspirin] and other. Past Medical History Patient  has a past medical history of Glaucoma, Hepatitis A, History of kidney stones, Migraines, and Pneumonia. Past Surgical History Patient  has a past surgical history that includes Shoulder arthroscopy (Right); Repair peroneal tendons ankle (Right); Lithotripsy (09/25/2017); Cervical polypectomy; and Extracorporeal shock wave lithotripsy (Left, 09/25/2017). Family History: Patient family history includes Basal cell carcinoma in her father; Healthy in her mother; Hyperlipidemia in her brother and father; Migraines in her brother, father, and sister; Pulmonary embolism in her brother. Social History:  Patient  reports that she has never smoked. She has never used smokeless tobacco. She reports current alcohol use. She reports that she does not use drugs.  Review of Systems: Constitutional: negative for fever or malaise Ophthalmic: negative for photophobia, double vision or  loss of vision Cardiovascular: negative for chest pain, dyspnea on exertion, or new LE swelling Respiratory: negative for SOB or persistent cough Gastrointestinal: negative for abdominal pain, change in bowel habits or melena Genitourinary: negative for dysuria or gross hematuria, no abnormal uterine  bleeding or disharge Musculoskeletal: negative for new gait disturbance or muscular weakness Integumentary: negative for new or persistent rashes, no breast lumps Neurological: negative for TIA or stroke symptoms Psychiatric: negative for SI or delusions Allergic/Immunologic: negative for hives  Patient Care Team    Relationship Specialty Notifications Start End  Jodie Lavern CROME, MD PCP - General Family Medicine  08/19/17     Objective  Vitals: BP 100/86   Pulse 70   Temp 98 F (36.7 C)   Ht 5' 8 (1.727 m)   Wt 152 lb 9.6 oz (69.2 kg)   SpO2 98%   BMI 23.20 kg/m  General:  Well developed, well nourished, no acute distress  Psych:  Alert and orientedx3,normal mood and affect HEENT:  Normocephalic, atraumatic, non-icteric sclera,  supple neck without adenopathy, mass or thyromegaly Cardiovascular:  Normal S1, S2, RRR without gallop, rub or murmur Respiratory:  Good breath sounds bilaterally, CTAB with normal respiratory effort Gastrointestinal: normal bowel sounds, soft, non-tender, no noted masses. No HSM MSK: extremities without edema, joints without erythema or swelling Neurologic:    Mental status is normal.  Gross motor and sensory exams are normal.  No tremor  Commons side effects, risks, benefits, and alternatives for medications and treatment plan prescribed today were discussed, and the patient expressed understanding of the given instructions. Patient is instructed to call or message via MyChart if he/she has any questions or concerns regarding our treatment plan. No barriers to understanding were identified. We discussed Red Flag symptoms and signs in detail. Patient expressed understanding regarding what to do in case of urgent or emergency type symptoms.  Medication list was reconciled, printed and provided to the patient in AVS. Patient instructions and summary information was reviewed with the patient as documented in the AVS. This note was prepared with assistance of  Dragon voice recognition software. Occasional wrong-word or sound-a-like substitutions may have occurred due to the inherent limitations of voice recognition software

## 2024-02-06 NOTE — Patient Instructions (Signed)
 Please return in 12 months for your annual complete physical; please come fasting.   I will release your lab results to you on your MyChart account with further instructions. You may see the results before I do, but when I review them I will send you a message with my report or have my assistant call you if things need to be discussed. Please reply to my message with any questions. Thank you!   If you have any questions or concerns, please don't hesitate to send me a message via MyChart or call the office at [phone]. Thank you for visiting with us  today! It's our pleasure caring for you.    VISIT SUMMARY: Today, we discussed your concerns about weight management, metabolic health, migraines, cholesterol levels, sleep disturbances, and dermatologic health. We also reviewed your cardiovascular medication and planned for colorectal cancer screening.  YOUR PLAN: -POSTMENOPAUSAL STATE WITH METABOLIC CONCERNS: Being postmenopausal can affect your metabolism, weight, and cholesterol levels. We discussed hormone replacement therapy (HRT) and GLP-1 agonists to support your metabolic health and weight management. You will start using an estrogen patch and take progesterone by mouth. We also talked about the potential use of testosterone cream and GLP-1 agonists for further support.  -MIGRAINE WITHOUT AURA: Migraines are severe headaches that can last for days. We discussed the potential benefits of Nurtec for managing your migraines.  -GLAUCOMA: Glaucoma is a condition that affects the eyes and can lead to vision loss. You should continue with your current eye drop regimen.  -COLORECTAL CANCER SCREENING: Colorectal cancer screening is important for early detection of cancer. We have ordered a Cologuard test for you.  INSTRUCTIONS: Please follow up with Reena to discuss GLP-1 agonists for metabolic health. Continue with your current eye drop regimen for glaucoma. Use the estrogen patch and take  progesterone by mouth as prescribed. Consider the potential use of testosterone cream for weight management. If you experience another migraine, consider trying Nurtec. We will monitor your cholesterol levels and may consider statin therapy if hormone therapy is not pursued. Complete the Cologuard test for colorectal cancer screening.                      Contains text generated by [employer]bridge.                                 Contains text generated by [employer]bridge.

## 2024-02-19 ENCOUNTER — Ambulatory Visit: Payer: Self-pay | Admitting: Family Medicine

## 2024-02-19 NOTE — Progress Notes (Signed)
 Labs reviewed.  The 10-year ASCVD risk score (Arnett DK, et al., 2019) is: 1.1%   Values used to calculate the score:     Age: 53 years     Clincally relevant sex: Female     Is Non-Hispanic African American: No     Diabetic: No     Tobacco smoker: No     Systolic Blood Pressure: 100 mmHg     Is BP treated: No     HDL Cholesterol: 85.8 mg/dL     Total Cholesterol: 271 mg/dL

## 2024-02-26 LAB — COLOGUARD: COLOGUARD: NEGATIVE

## 2024-02-29 NOTE — Progress Notes (Signed)
Negative cologuard. Mc note sent

## 2024-03-15 ENCOUNTER — Other Ambulatory Visit: Payer: Self-pay | Admitting: Family Medicine

## 2024-03-15 DIAGNOSIS — Z1231 Encounter for screening mammogram for malignant neoplasm of breast: Secondary | ICD-10-CM

## 2024-04-08 ENCOUNTER — Ambulatory Visit
Admission: RE | Admit: 2024-04-08 | Discharge: 2024-04-08 | Disposition: A | Discharge status: Home / Self Care | Source: Ambulatory Visit | Attending: Family Medicine | Admitting: Family Medicine

## 2024-04-08 DIAGNOSIS — Z1231 Encounter for screening mammogram for malignant neoplasm of breast: Secondary | ICD-10-CM

## 2024-04-20 ENCOUNTER — Other Ambulatory Visit: Payer: Self-pay | Admitting: Family Medicine

## 2024-04-20 ENCOUNTER — Telehealth: Payer: Self-pay

## 2024-04-20 DIAGNOSIS — R928 Other abnormal and inconclusive findings on diagnostic imaging of breast: Secondary | ICD-10-CM

## 2024-04-20 NOTE — Telephone Encounter (Signed)
 Stacy from the [facility] called and expressed that she has sent over forms to be completed for patient to complete her six month breast exam. Patient is scheduled to complete exam tomorrow morning but can not complete exam without signed documentation. Please confirm if you have received forms and reviewed and sign forms. Please give to a nurse to fax back to [facility] please and thank you.

## 2024-04-21 ENCOUNTER — Inpatient Hospital Stay: Admission: RE | Admit: 2024-04-21 | Discharge: 2024-04-21 | Attending: Family Medicine | Admitting: Family Medicine

## 2024-04-21 ENCOUNTER — Other Ambulatory Visit: Payer: Self-pay | Admitting: Family Medicine

## 2024-04-21 DIAGNOSIS — R928 Other abnormal and inconclusive findings on diagnostic imaging of breast: Secondary | ICD-10-CM

## 2024-04-21 NOTE — Telephone Encounter (Signed)
 Signed and faxed

## 2024-04-21 NOTE — Telephone Encounter (Signed)
 Papers have been faxed with conformation receipt.  Copied from CRM #[id number]. Topic: Clinical - Request for Lab/Test Order >> Apr 21, 2024  8:09 AM Laymon HERO wrote: Reason for CRM: [facility] # [phone] ext [phone]- needing to get a sign off on ultrasound order- Patient has an appt today at 1pm

## 2024-04-21 NOTE — Telephone Encounter (Addendum)
 Completed form has been faxed to the breast center at [phone] and [phone] per the request of Stacy.

## 2024-10-21 ENCOUNTER — Other Ambulatory Visit

## 2025-02-07 ENCOUNTER — Encounter: Admitting: Family Medicine
